# Patient Record
Sex: Male | Born: 1935 | Race: White | Hispanic: No | Marital: Married | Smoking: Former smoker
Health system: Southern US, Community
[De-identification: ages and names within clinical notes are randomized; demographics above are authoritative.]

## PROBLEM LIST (undated history)

## (undated) DIAGNOSIS — I10 Essential (primary) hypertension: Secondary | ICD-10-CM

## (undated) DIAGNOSIS — K219 Gastro-esophageal reflux disease without esophagitis: Secondary | ICD-10-CM

## (undated) DIAGNOSIS — I2510 Atherosclerotic heart disease of native coronary artery without angina pectoris: Secondary | ICD-10-CM

## (undated) DIAGNOSIS — T8859XA Other complications of anesthesia, initial encounter: Secondary | ICD-10-CM

## (undated) DIAGNOSIS — Z8619 Personal history of other infectious and parasitic diseases: Secondary | ICD-10-CM

## (undated) DIAGNOSIS — R32 Unspecified urinary incontinence: Secondary | ICD-10-CM

## (undated) DIAGNOSIS — I4891 Unspecified atrial fibrillation: Secondary | ICD-10-CM

## (undated) DIAGNOSIS — R001 Bradycardia, unspecified: Secondary | ICD-10-CM

## (undated) DIAGNOSIS — E785 Hyperlipidemia, unspecified: Secondary | ICD-10-CM

## (undated) DIAGNOSIS — C61 Malignant neoplasm of prostate: Secondary | ICD-10-CM

## (undated) DIAGNOSIS — T4145XA Adverse effect of unspecified anesthetic, initial encounter: Secondary | ICD-10-CM

## (undated) DIAGNOSIS — K4090 Unilateral inguinal hernia, without obstruction or gangrene, not specified as recurrent: Secondary | ICD-10-CM

## (undated) DIAGNOSIS — I499 Cardiac arrhythmia, unspecified: Secondary | ICD-10-CM

## (undated) HISTORY — DX: Atherosclerotic heart disease of native coronary artery without angina pectoris: I25.10

## (undated) HISTORY — DX: Hyperlipidemia, unspecified: E78.5

## (undated) HISTORY — DX: Malignant neoplasm of prostate: C61

## (undated) HISTORY — PX: PROSTATECTOMY: SHX69

## (undated) HISTORY — PX: CATARACT EXTRACTION: SUR2

## (undated) HISTORY — DX: Bradycardia, unspecified: R00.1

## (undated) HISTORY — DX: Essential (primary) hypertension: I10

## (undated) HISTORY — PX: CORONARY ARTERY BYPASS GRAFT: SHX141

---

## 2007-12-28 ENCOUNTER — Inpatient Hospital Stay (HOSPITAL_COMMUNITY): Admission: RE | Admit: 2007-12-28 | Discharge: 2007-12-30 | Payer: Self-pay | Admitting: Urology

## 2007-12-28 ENCOUNTER — Encounter (INDEPENDENT_AMBULATORY_CARE_PROVIDER_SITE_OTHER): Payer: Self-pay | Admitting: Urology

## 2010-10-09 NOTE — Discharge Summary (Signed)
NAME:  Lance Trujillo, Lance Trujillo NO.:  [id number]   MEDICAL RECORD NO.:  [id number]          PATIENT TYPE:  INP   LOCATION:  1406                         FACILITY:  [facility]   PHYSICIAN:  Heloise Purpura, MD      DATE OF BIRTH:  [birth year]   DATE OF ADMISSION:  12/28/2007  DATE OF DISCHARGE:                               DISCHARGE SUMMARY   ADMISSION DIAGNOSIS:  Clinically localized prostate cancer.   DISCHARGE DIAGNOSIS:  Clinically localized prostate cancer.   HISTORY AND PHYSICAL:  For full details please see admission history and  physical.  Briefly, Lance Trujillo is a 75 year old gentleman who was found to  have clinically localized adenocarcinoma of prostate.  After discussion  regarding management options for treatment, he elected to proceed with  surgical therapy and a robotic assisted laparoscopic radical  prostatectomy.   HOSPITAL COURSE:  On December 28, 2007, he was taken to the operating room  and underwent a robotic assisted laparoscopic radical prostatectomy  which he tolerated well and without complications.  Postoperatively, he  was able to be transferred to a regular hospital room and was able to  begin ambulation that evening.  He remained hemodynamically stable.  His  postoperative hematocrit was 45.6.  On the morning of postoperative day  #1, his hematocrit was rechecked and found to be 41.0.  Although his  blood pressure remained stable and his urine output remained excellent,  it was decided to recheck his hematocrit later on that morning and at  approximately noon this day was found to be 38.3 indicating that it was  indeed stable and mostly dilutional.  He maintained excellent urine  output with minimal output from his pelvic drain and his pelvic drain  was removed.  He was placed on a clear liquid diet and continued to  ambulate.  He was reevaluated on the afternoon of postoperative day #1  and had significant abdominal discomfort with some nausea and was  not  tolerating his clear liquid diet well.  It was therefore decided to  proceed with further observation.  He was administered a Dulcolax  suppository and by the morning of postoperative day #2, he had passed  flatus and felt much improved.  He was therefore felt to be stable for  discharge and had met all discharge criteria.   DISPOSITION:  Home.   DISCHARGE MEDICATIONS:  He was instructed to resume his regular home  medications including Toprol, Lipitor, and hydrochlorothiazide.  In  addition, he was provided a prescription for Vicodin to take as needed  for pain and told to use Colace as a stool softener.  He was also given  a prescription to begin Cipro 1 day prior to his return visit for Foley  catheter removal.   DISCHARGE INSTRUCTIONS:  He was instructed to be ambulatory but  specifically told to refrain from any heavy lifting, strenuous activity,  or driving.  He was instructed on routine Foley catheter care and told  to gradually advance his diet over the course of the next few days.   SURGICAL PATHOLOGY:  His surgical pathology results did return while  hospitalized which indicated a PT2, CNX, MX, Gleason score 3+4 equals 7  adenocarcinoma of the prostate.  Surgical margins were negative.  These  results were discussed with the patient during his hospitalization.   FOLLOW UP:  He will follow-up in 1 week for Foley catheter removal.      Heloise Purpura, MD  Electronically Signed     LB/MEDQ  D:  12/30/2007  T:  12/30/2007  Job:  [id number]

## 2010-10-09 NOTE — Op Note (Signed)
NAME:  Lance Trujillo, Lance Trujillo NO.:  [id number]   MEDICAL RECORD NO.:  [id number]          PATIENT TYPE:  INP   LOCATION:  0004                         FACILITY:  [facility]   PHYSICIAN:  Heloise Purpura, MD      DATE OF BIRTH:  [birth year]   DATE OF PROCEDURE:  12/28/2007  DATE OF DISCHARGE:                               OPERATIVE REPORT   PREOPERATIVE DIAGNOSIS:  Clinically localized adenocarcinoma of the  prostate (clinical stage T1C NX MX).   POSTOPERATIVE DIAGNOSIS:  Clinically localized adenocarcinoma of the  prostate (clinical stage T1C NX MX).   PROCEDURE:  Robotic assisted laparoscopic radical prostatectomy  (bilateral nerve sparing).   SURGEON:  Heloise Purpura, M.D.   ASSISTANT:  Dr. Delman Kitten.   ANESTHESIA:  General.   COMPLICATIONS:  None.   ESTIMATED BLOOD LOSS:  75 mL.   SPECIMENS:  Prostate and seminal vesicles.  Disposition of specimen to  pathology.   DRAINS:  1. A 20-French coude catheter.  2. A #19 Blake pelvic drain.   INDICATIONS:  Lance Trujillo is a 75 year old gentleman with clinically  localized adenocarcinoma of the prostate.  After discussion regarding  management options for treatment, he elected to proceed with surgical  therapy and the above procedure.  Potential risks, complications, and  alternative options associated with this procedure were discussed in  detail and informed consent was obtained.   DESCRIPTION OF PROCEDURE:  The patient was taken to the operating room  and a general anesthetic was administered.  He was given preoperative  antibiotics, placed in the dorsal lithotomy position, prepped and draped  in the usual sterile fashion.  Next a preoperative time-out was  performed.  A Foley catheter was then inserted into the bladder and a  site was selected just to the left of the umbilicus for placement of the  camera port.  This was placed using a standard open Hasson technique.  This allowed entry into the peritoneal cavity  under direct vision  without difficulty.  A 12 mm port was then placed and a pneumoperitoneum  was established.  The 0 degree lens was then used to inspect the abdomen  and there was no evidence of any intra-abdominal injuries or other  abnormalities.  The remaining ports were then placed.  Bilateral 8 mm  robotic ports were placed 10 cm lateral to and just inferior to the  camera port site.  An additional 8 mm robotic port was placed in the far  left lateral abdominal wall.  A 5 mm port was placed between the camera  port and the right robotic port and an additional 12 mm port was placed  in the far right lateral abdominal wall for laparoscopic assistance.  All ports were placed under direct vision and without difficulty.  The  surgical cart was then docked.  With the aid of the cautery scissors,  the bladder was reflected posteriorly allowing entry into the space of  Retzius and identification of the endopelvic fascia and prostate.  The  endopelvic fascia was then incised from the apex back to the  base of  prostate bilaterally and the underlying levator muscle fibers were swept  laterally off the prostate.  On the right side, an accessory obturator  artery was identified and was able to be preserved.  The dorsal vein was  thereby isolated.  It was then stapled and divided with a 45 mm flex ETS  stapler.  The bladder neck was then identified with the aid of Foley  catheter manipulation and was divided anteriorly thereby exposing the  catheter.  The catheter balloon was deflated and the catheter was  brought into the operative field and used to retract the prostate  anteriorly.  The posterior bladder neck was then divided and dissection  proceeded posteriorly until the vasa deferentia and seminal vesicles  were identified.  The vasa deferentia were isolated, divided and lifted  anteriorly.  The seminal vessels were then dissected down to their tips  with care to control the seminal vesicle  arterial blood supply with Hem-  o-lok clips.  The space between Denonvilliers fascia and the anterior  rectum was then bluntly developed thereby isolating the vascular  pedicles of the prostate.  The lateral prostatic fascia was then sharply  divided allowing the neurovascular bundles to be released posteriorly  and laterally on each side.  The vascular pedicles of the prostate were  then ligated with Hem-o-lok clips above the level of the neurovascular  bundles and divided with sharp cold scissor dissection.  The  neurovascular bundles were then swept off the apex of the prostate and  urethra.  The urethra was sharply divided allowing the prostate specimen  to be disarticulated.  The pelvis was then copiously irrigated and  hemostasis was ensured.  With irrigation of the pelvis, air was injected  into the rectal catheter and there was no evidence for any rectal  injury.  Attention then turned to the anastomosis.  A 2-0 Vicryl slip-  knot was placed at the 6 o'clock position between the bladder neck,  Denonvilliers fascia, and the urethra to reapproximate these structures.  A double-armed 3-0 Monocryl suture was then used to perform a 360 degree  running tension-free anastomosis between the bladder neck and urethra.  A 20-French coude catheter was inserted into the bladder and irrigated.  The anastomosis appeared to be watertight and there were no blood clots  within the bladder.  The #19 Harrison Mons drain was then brought through the  left robotic port and appropriately positioned in the pelvis.  It was  secured to the skin with a nylon suture.  The surgical cart was then  undocked.  The right lateral 12 mm port site was closed with a 0 Vicryl  suture placed with the aid of the [employer] needle.  The prostate  specimen was then removed intact via the periumbilical port site with  the Endopouch retrieval bag.  This fascial opening was closed with a  running 0 Vicryl suture.  All remaining  ports had been removed under  direct vision and without difficulty.  The drain was sutured in place  with a nylon suture.  All port sites were then injected with 0.25%  Marcaine and reapproximated at the skin level with staples.  Sterile  dressings were applied.  The patient appeared to tolerate the procedure  well and without complications.  He was able to be extubated and  transferred to the recovery unit in satisfactory condition.      Heloise Purpura, MD  Electronically Signed     LB/MEDQ  D:  12/28/2007  T:  12/28/2007  Job:  [id number]

## 2011-02-22 LAB — BASIC METABOLIC PANEL
BUN: 9
Calcium: 9.7
Creatinine, Ser: 1.04
GFR calc non Af Amer: >60
Glucose, Bld: 91
Potassium: 4

## 2011-02-22 LAB — HEMOGLOBIN AND HEMATOCRIT, BLOOD
HCT: 39.3
Hemoglobin: 14.1

## 2011-02-22 LAB — CBC
HCT: 48.1
Platelets: 198
RDW: 13.3

## 2011-02-22 LAB — TYPE AND SCREEN: ABO/RH(D): O POS

## 2011-12-30 ENCOUNTER — Ambulatory Visit (INDEPENDENT_AMBULATORY_CARE_PROVIDER_SITE_OTHER): Payer: Medicare Other | Admitting: Nurse Practitioner

## 2011-12-30 ENCOUNTER — Encounter: Payer: Self-pay | Admitting: Nurse Practitioner

## 2011-12-30 ENCOUNTER — Telehealth: Payer: Self-pay | Admitting: *Deleted

## 2011-12-30 ENCOUNTER — Ambulatory Visit
Admission: RE | Admit: 2011-12-30 | Discharge: 2011-12-30 | Disposition: A | Discharge status: Home / Self Care | Payer: Medicare Other | Source: Ambulatory Visit | Attending: Nurse Practitioner | Admitting: Nurse Practitioner

## 2011-12-30 ENCOUNTER — Other Ambulatory Visit: Payer: Self-pay | Admitting: Nurse Practitioner

## 2011-12-30 VITALS — BP 172/78 | HR 45 | Ht 66.0 in | Wt 159.1 lb

## 2011-12-30 DIAGNOSIS — R079 Chest pain, unspecified: Secondary | ICD-10-CM

## 2011-12-30 DIAGNOSIS — Z01810 Encounter for preprocedural cardiovascular examination: Secondary | ICD-10-CM

## 2011-12-30 LAB — PROTIME-INR
INR: 1 ratio (ref 0.8–1.0)
Prothrombin Time: 11.4 s (ref 10.2–12.4)

## 2011-12-30 LAB — HEPATIC FUNCTION PANEL
ALT: 32 U/L (ref 0–53)
AST: 28 U/L (ref 0–37)
Albumin: 4.3 g/dL (ref 3.5–5.2)
Alkaline Phosphatase: 60 U/L (ref 39–117)
Bilirubin, Direct: 0.1 mg/dL (ref 0.0–0.3)
Total Bilirubin: 0.9 mg/dL (ref 0.3–1.2)
Total Protein: 7.7 g/dL (ref 6.0–8.3)

## 2011-12-30 LAB — BASIC METABOLIC PANEL
BUN: 9 mg/dL (ref 6–23)
CO2: 29 mEq/L (ref 19–32)
Calcium: 9.6 mg/dL (ref 8.4–10.5)
Chloride: 100 mEq/L (ref 96–112)
Creatinine, Ser: 1 mg/dL (ref 0.4–1.5)
GFR: 79.44 mL/min (ref >60.00)
Glucose, Bld: 84 mg/dL (ref 70–99)
Potassium: 4.1 mEq/L (ref 3.5–5.1)
Sodium: 137 mEq/L (ref 135–145)

## 2011-12-30 LAB — CBC WITH DIFFERENTIAL/PLATELET
Basophils Absolute: 0 10*3/uL (ref 0.0–0.1)
Basophils Relative: 0.5 % (ref 0.0–3.0)
Eosinophils Absolute: 0.1 10*3/uL (ref 0.0–0.7)
Eosinophils Relative: 1 % (ref 0.0–5.0)
HCT: 50.2 % (ref 39.0–52.0)
Hemoglobin: 16.8 g/dL (ref 13.0–17.0)
Lymphocytes Relative: 16 % (ref 12.0–46.0)
Lymphs Abs: 0.9 10*3/uL (ref 0.7–4.0)
MCHC: 33.3 g/dL (ref 30.0–36.0)
MCV: 85.7 fl (ref 78.0–100.0)
Monocytes Absolute: 0.4 10*3/uL (ref 0.1–1.0)
Monocytes Relative: 6.2 % (ref 3.0–12.0)
Neutro Abs: 4.4 10*3/uL (ref 1.4–7.7)
Neutrophils Relative %: 76.3 % (ref 43.0–77.0)
Platelets: 207 10*3/uL (ref 150.0–400.0)
RBC: 5.86 Mil/uL — ABNORMAL HIGH (ref 4.22–5.81)
RDW: 13.6 % (ref 11.5–14.6)
WBC: 5.7 10*3/uL (ref 4.5–10.5)

## 2011-12-30 LAB — APTT: aPTT: 35.7 s — ABNORMAL HIGH (ref 21.7–28.8)

## 2011-12-30 LAB — LIPID PANEL
Cholesterol: 154 mg/dL (ref 0–200)
HDL: 45 mg/dL (ref >39.00)
LDL Cholesterol: 86 mg/dL (ref 0–99)
Total CHOL/HDL Ratio: 3
Triglycerides: 114 mg/dL (ref 0.0–149.0)
VLDL: 22.8 mg/dL (ref 0.0–40.0)

## 2011-12-30 MED ORDER — NITROGLYCERIN 0.4 MG SL SUBL: 0.4000 mg | SUBLINGUAL_TABLET | SUBLINGUAL | Status: DC | PRN

## 2011-12-30 MED ORDER — ASPIRIN 81 MG PO TBEC: 81.0000 mg | DELAYED_RELEASE_TABLET | Freq: Every day | ORAL | Status: AC

## 2011-12-30 NOTE — Telephone Encounter (Signed)
Message copied by Awilda Bill on Mon Dec 30, 2011  4:54 PM ------      Message from: Rosalio Macadamia      Created: Mon Dec 30, 2011  1:39 PM       Ok to report. CXR is ok. He is staying at his son's house here in [facility].

## 2011-12-30 NOTE — Patient Instructions (Addendum)
We are going to check labs today.   We are going to arrange for a heart catheterization tomorrow  We need to get a chest x ray today at [facility] at the [facility]  You are scheduled for a cardiac catheterization on Tuesday, August 6th with Dr. Swaziland.  Go to [facility] on Tuesday at 11AM. No food or drink after midnight on tonight. You may take your medications with a sip of water on the day of your procedure.   Start aspirin 81 mg daily  I have sent a prescription for nitroglycerin to the drug store. Use your NTG under your tongue for recurrent chest pain. May take one tablet every 5 minutes. If you are still having discomfort after 3 tablets in 15 minutes, call 911.  Call the [facility] office at [phone] if you have any questions, problems or concerns.

## 2011-12-30 NOTE — Telephone Encounter (Signed)
Called number listed for patients son Riaz Onorato.  Left voicemail for patient to call back.  Vista Mink, CMA

## 2011-12-30 NOTE — Telephone Encounter (Signed)
Dr. Carolynne Edouard returned my call regarding his fathers chest x-ray.  Gave me Alice Houchen's cell number.  Called patient and notified of cxr results.  Vista Mink, CMA

## 2011-12-30 NOTE — Assessment & Plan Note (Signed)
He is describing classic anginal symptoms. Has HTN and HLD with a strikingly positive family history. Former smoker. I have recommended aspirin 81 mg. Samples are given. Have also advised proceeding on with cardiac catheterization tomorrow with Dr. Swaziland. He is agreeable. He has had no symptoms at rest. NTG was prescribed today as well and full instruction given. The risk, procedure, and benefits have been reviewed and he is willing to proceed. Will check labs and CXR today. Patient is agreeable to this plan and will call if any problems develop in the interim.

## 2011-12-30 NOTE — Progress Notes (Signed)
 Lance Trujillo Date of Birth: 04/08/1936 Medical Record #[id number]  History of Present Illness: Lance Trujillo is seen today for a new patient visit. He is seen for Dr. Jordan. He has seen Dr. Jordan approximately 3 to 4 years ago. Those records are not available. He is Dr. Paci's father. He has had HTN x 10 years and HLD. No known heart disease. Had remote stress testing prior to his surgery for prostate cancer. 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   He comes in today. He is here with his son. He reports at least a one year history of DOE. This is progressively getting worse. He is now almost unable to cut his grass or take his garbage can to the street. For the past couple of months, he has had an exertional tightness in his chest that resolves with rest. He has had no symptoms at rest. He notes that his symptoms seem to be getting worse and more frequent, certainly not going away.  He has a chronic bradycardia. No known DM. Family history is strikingly positive for CAD. Former smoker. No regular exercise but tries to stay busy. Family found out about his symptoms. He tried to get in with cardiology at [employer] since he is a former employee but was told he could not be seen until 2014. He does not check his blood pressure at home. Saw his PCP about 3 months ago and it was "ok".   Current Outpatient Prescriptions on File Prior to Visit  Medication Sig Dispense Refill  . atorvastatin (LIPITOR) 10 MG tablet Take 10 mg by mouth daily.      . hydrochlorothiazide (HYDRODIURIL) 50 MG tablet Take 50 mg by mouth daily.      . metoprolol succinate (TOPROL XL) 25 MG 24 hr tablet Take 12.5 mg by mouth daily.      . nitroGLYCERIN (NITROSTAT) 0.4 MG SL tablet Place 1 tablet (0.4 mg total) under the tongue every 5 (five) minutes as needed for chest pain.  25 tablet  3    Allergies  Allergen Reactions  . Clindamycin/Lincomycin     Past Medical History  Diagnosis Date  . Prostate  cancer     s/p robotic prostatectomy per Dr. Borden  . HTN (hypertension)   . HLD (hyperlipidemia)   . Bradycardia     Past Surgical History  Procedure Date  . Prostatectomy   . Cataract extraction     History  Smoking status  . Former Smoker  . Types: Cigarettes  . Quit date: 12/29/1981  Smokeless tobacco  . Never Used    History  Alcohol Use  . Yes    occasional, formerly heavy intermittent use    History reviewed. No pertinent family history.  Review of Systems: The review of systems is per the HPI.  All other systems were reviewed and are negative.  Physical Exam: BP 172/78  Pulse 45  Ht 5' 6" (1.676 m)  Wt 159 lb 1.9 oz (72.176 kg)  BMI 25.68 kg/m2 Patient is very pleasant and in no acute distress. Skin is warm and dry. Color is normal.  HEENT is unremarkable. Normocephalic/atraumatic. PERRL. Sclera are nonicteric. Neck is supple. No masses. No JVD. Lungs show decreased breath sounds. Cardiac exam shows a regular rate and rhythm, yet slow. Abdomen is soft. Extremities are without edema. Gait and ROM are intact. No gross neurologic deficits noted.   LABORATORY DATA: EKG shows sinus bradycardia.   No acute changes. Tracing was reviewed with Dr. Brackbill today (DOD).   Other labs are pending. CXR is pending.    Assessment / Plan:  

## 2011-12-31 ENCOUNTER — Encounter (HOSPITAL_COMMUNITY): Payer: Self-pay | Admitting: Certified Registered"

## 2011-12-31 ENCOUNTER — Other Ambulatory Visit: Payer: Self-pay | Admitting: Cardiothoracic Surgery

## 2011-12-31 ENCOUNTER — Ambulatory Visit (HOSPITAL_COMMUNITY): Payer: Medicare Other | Admitting: Certified Registered"

## 2011-12-31 ENCOUNTER — Inpatient Hospital Stay (HOSPITAL_COMMUNITY)
Admission: RE | Admit: 2011-12-31 | Discharge: 2012-01-09 | DRG: 234 | Disposition: A | Discharge status: Home / Self Care | Payer: Medicare Other | Source: Ambulatory Visit | Attending: Cardiothoracic Surgery | Admitting: Cardiothoracic Surgery

## 2011-12-31 ENCOUNTER — Inpatient Hospital Stay (HOSPITAL_COMMUNITY): Payer: Medicare Other

## 2011-12-31 ENCOUNTER — Encounter (HOSPITAL_COMMUNITY)
Admission: RE | Disposition: A | Discharge status: Home / Self Care | Payer: Self-pay | Source: Ambulatory Visit | Attending: Cardiothoracic Surgery

## 2011-12-31 DIAGNOSIS — Z01810 Encounter for preprocedural cardiovascular examination: Secondary | ICD-10-CM

## 2011-12-31 DIAGNOSIS — I4891 Unspecified atrial fibrillation: Secondary | ICD-10-CM | POA: Diagnosis not present

## 2011-12-31 DIAGNOSIS — R001 Bradycardia, unspecified: Secondary | ICD-10-CM

## 2011-12-31 DIAGNOSIS — R079 Chest pain, unspecified: Secondary | ICD-10-CM

## 2011-12-31 DIAGNOSIS — I2510 Atherosclerotic heart disease of native coronary artery without angina pectoris: Secondary | ICD-10-CM

## 2011-12-31 DIAGNOSIS — Z951 Presence of aortocoronary bypass graft: Secondary | ICD-10-CM

## 2011-12-31 DIAGNOSIS — Z79899 Other long term (current) drug therapy: Secondary | ICD-10-CM

## 2011-12-31 DIAGNOSIS — E876 Hypokalemia: Secondary | ICD-10-CM | POA: Clinically undetermined

## 2011-12-31 DIAGNOSIS — E785 Hyperlipidemia, unspecified: Secondary | ICD-10-CM | POA: Diagnosis present

## 2011-12-31 DIAGNOSIS — I493 Ventricular premature depolarization: Secondary | ICD-10-CM | POA: Diagnosis present

## 2011-12-31 DIAGNOSIS — Z8249 Family history of ischemic heart disease and other diseases of the circulatory system: Secondary | ICD-10-CM

## 2011-12-31 DIAGNOSIS — I498 Other specified cardiac arrhythmias: Secondary | ICD-10-CM | POA: Diagnosis present

## 2011-12-31 DIAGNOSIS — E039 Hypothyroidism, unspecified: Secondary | ICD-10-CM | POA: Diagnosis not present

## 2011-12-31 DIAGNOSIS — Z7982 Long term (current) use of aspirin: Secondary | ICD-10-CM

## 2011-12-31 DIAGNOSIS — I200 Unstable angina: Secondary | ICD-10-CM | POA: Diagnosis present

## 2011-12-31 DIAGNOSIS — E8779 Other fluid overload: Secondary | ICD-10-CM | POA: Diagnosis not present

## 2011-12-31 DIAGNOSIS — I10 Essential (primary) hypertension: Secondary | ICD-10-CM | POA: Diagnosis present

## 2011-12-31 DIAGNOSIS — I4949 Other premature depolarization: Secondary | ICD-10-CM | POA: Diagnosis not present

## 2011-12-31 DIAGNOSIS — D62 Acute posthemorrhagic anemia: Secondary | ICD-10-CM | POA: Diagnosis not present

## 2011-12-31 DIAGNOSIS — Z8546 Personal history of malignant neoplasm of prostate: Secondary | ICD-10-CM

## 2011-12-31 DIAGNOSIS — J9819 Other pulmonary collapse: Secondary | ICD-10-CM | POA: Diagnosis not present

## 2011-12-31 DIAGNOSIS — Z87891 Personal history of nicotine dependence: Secondary | ICD-10-CM

## 2011-12-31 DIAGNOSIS — J90 Pleural effusion, not elsewhere classified: Secondary | ICD-10-CM | POA: Diagnosis not present

## 2011-12-31 HISTORY — PX: CORONARY ARTERY BYPASS GRAFT: SHX141

## 2011-12-31 HISTORY — DX: Atherosclerotic heart disease of native coronary artery without angina pectoris: I25.10

## 2011-12-31 HISTORY — PX: LEFT HEART CATHETERIZATION WITH CORONARY ANGIOGRAM: SHX5451

## 2011-12-31 HISTORY — DX: Unspecified atrial fibrillation: I48.91

## 2011-12-31 LAB — POCT I-STAT 4, (NA,K, GLUC, HGB,HCT)
Glucose, Bld: 81 mg/dL (ref 70–99)
Glucose, Bld: 85 mg/dL (ref 70–99)
HCT: 27 % — ABNORMAL LOW (ref 39.0–52.0)
HCT: 23 % — ABNORMAL LOW (ref 39.0–52.0)
Hemoglobin: 12.9 g/dL — ABNORMAL LOW (ref 13.0–17.0)
Hemoglobin: 8.5 g/dL — ABNORMAL LOW (ref 13.0–17.0)
Hemoglobin: 7.8 g/dL — ABNORMAL LOW (ref 13.0–17.0)
Potassium: 3.4 mEq/L — ABNORMAL LOW (ref 3.5–5.1)
Potassium: 3.5 mEq/L (ref 3.5–5.1)
Potassium: 3.6 meq/L (ref 3.5–5.1)
Sodium: 139 mEq/L (ref 135–145)
Sodium: 135 mEq/L (ref 135–145)
Sodium: 136 mEq/L (ref 135–145)
Sodium: 135 meq/L (ref 135–145)

## 2011-12-31 LAB — TYPE AND SCREEN
ABO/RH(D): O POS
Antibody Screen: NEGATIVE

## 2011-12-31 LAB — CBC
HCT: 35.9 % — ABNORMAL LOW (ref 39.0–52.0)
MCH: 28.8 pg (ref 26.0–34.0)
MCV: 82 fL (ref 78.0–100.0)
RDW: 13.1 % (ref 11.5–15.5)
WBC: 8.2 10*3/uL (ref 4.0–10.5)

## 2011-12-31 LAB — APTT: aPTT: 44 seconds — ABNORMAL HIGH (ref 24–37)

## 2011-12-31 LAB — HEMOGLOBIN AND HEMATOCRIT, BLOOD
HCT: 25.7 % — ABNORMAL LOW (ref 39.0–52.0)
Hemoglobin: 9 g/dL — ABNORMAL LOW (ref 13.0–17.0)

## 2011-12-31 LAB — POCT I-STAT 3, ART BLOOD GAS (G3+)
Bicarbonate: 24.9 mEq/L — ABNORMAL HIGH (ref 20.0–24.0)
O2 Saturation: 100 %
pO2, Arterial: 274 mmHg — ABNORMAL HIGH (ref 80.0–100.0)

## 2011-12-31 LAB — POCT I-STAT GLUCOSE
Glucose, Bld: 88 mg/dL (ref 70–99)
Operator id: 3406

## 2011-12-31 LAB — PLATELET COUNT: Platelets: 92 10*3/uL — ABNORMAL LOW (ref 150–400)

## 2011-12-31 SURGERY — CORONARY ARTERY BYPASS GRAFTING (CABG)
Anesthesia: General | Site: Chest | Wound class: Clean

## 2011-12-31 SURGERY — LEFT HEART CATHETERIZATION WITH CORONARY ANGIOGRAM
Anesthesia: LOCAL

## 2011-12-31 MED ORDER — SODIUM BICARBONATE 8.4 % IV SOLN
INTRAVENOUS | Status: DC
  Filled 2011-12-31: qty 2.5

## 2011-12-31 MED ORDER — SODIUM CHLORIDE 0.9 % IJ SOLN
OROMUCOSAL | Status: DC | PRN
  Administered 2011-12-31 (×3): via TOPICAL

## 2011-12-31 MED ORDER — SODIUM CHLORIDE 0.9 % IV SOLN: 250.0000 mL | INTRAVENOUS | Status: DC

## 2011-12-31 MED ORDER — DEXTROSE 5 % IV SOLN
1.5000 g | Freq: Two times a day (BID) | INTRAVENOUS | Status: DC
  Administered 2011-12-31 – 2012-01-01 (×3): 1.5 g via INTRAVENOUS
  Filled 2011-12-31 (×4): qty 1.5

## 2011-12-31 MED ORDER — DEXMEDETOMIDINE HCL IN NACL 200 MCG/50ML IV SOLN: 0.1000 ug/kg/h | INTRAVENOUS | Status: DC

## 2011-12-31 MED ORDER — SODIUM CHLORIDE 0.9 % IJ SOLN: 3.0000 mL | INTRAMUSCULAR | Status: DC | PRN

## 2011-12-31 MED ORDER — ROCURONIUM BROMIDE 100 MG/10ML IV SOLN
INTRAVENOUS | Status: DC | PRN
  Administered 2011-12-31 (×2): 50 mg via INTRAVENOUS

## 2011-12-31 MED ORDER — LACTATED RINGERS IV SOLN
INTRAVENOUS | Status: DC | PRN
  Administered 2011-12-31 (×2): via INTRAVENOUS

## 2011-12-31 MED ORDER — VANCOMYCIN HCL 1000 MG IV SOLR
1250.0000 mg | INTRAVENOUS | Status: AC
  Administered 2011-12-31: 1250 mg via INTRAVENOUS
  Filled 2011-12-31: qty 1250

## 2011-12-31 MED ORDER — METOPROLOL TARTRATE 12.5 MG HALF TABLET
12.5000 mg | ORAL_TABLET | Freq: Two times a day (BID) | ORAL | Status: DC
  Administered 2012-01-01 (×2): 12.5 mg via ORAL
  Filled 2011-12-31 (×5): qty 1

## 2011-12-31 MED ORDER — ACETAMINOPHEN 160 MG/5ML PO SOLN
975.0000 mg | Freq: Four times a day (QID) | ORAL | Status: DC
Start: 2012-01-01 — End: 2012-01-02

## 2011-12-31 MED ORDER — POTASSIUM CHLORIDE 2 MEQ/ML IV SOLN
80.0000 meq | INTRAVENOUS | Status: DC
  Filled 2011-12-31: qty 40

## 2011-12-31 MED ORDER — INSULIN REGULAR BOLUS VIA INFUSION
0.0000 [IU] | Freq: Three times a day (TID) | INTRAVENOUS | Status: DC
  Filled 2011-12-31: qty 10

## 2011-12-31 MED ORDER — ALBUMIN HUMAN 5 % IV SOLN
INTRAVENOUS | Status: DC | PRN
  Administered 2011-12-31: 20:00:00 via INTRAVENOUS

## 2011-12-31 MED ORDER — TRANEXAMIC ACID (OHS) BOLUS VIA INFUSION
15.0000 mg/kg | INTRAVENOUS | Status: AC
  Administered 2011-12-31: 1081.5 mg via INTRAVENOUS
  Filled 2011-12-31: qty 1082

## 2011-12-31 MED ORDER — ACETAMINOPHEN 650 MG RE SUPP: 650.0000 mg | RECTAL | Status: AC

## 2011-12-31 MED ORDER — 0.9 % SODIUM CHLORIDE (POUR BTL) OPTIME
TOPICAL | Status: DC | PRN
  Administered 2011-12-31: 6000 mL

## 2011-12-31 MED ORDER — ASPIRIN 81 MG PO CHEW: 324.0000 mg | CHEWABLE_TABLET | Freq: Every day | ORAL | Status: DC

## 2011-12-31 MED ORDER — FAMOTIDINE IN NACL 20-0.9 MG/50ML-% IV SOLN
20.0000 mg | Freq: Two times a day (BID) | INTRAVENOUS | Status: DC
  Administered 2011-12-31: 20 mg via INTRAVENOUS

## 2011-12-31 MED ORDER — HEMOSTATIC AGENTS (NO CHARGE) OPTIME
TOPICAL | Status: DC | PRN
  Administered 2011-12-31 (×2): 1 via TOPICAL

## 2011-12-31 MED ORDER — MAGNESIUM SULFATE 40 MG/ML IJ SOLN
4.0000 g | Freq: Once | INTRAMUSCULAR | Status: AC
  Administered 2011-12-31: 4 g via INTRAVENOUS
  Filled 2011-12-31: qty 100

## 2011-12-31 MED ORDER — SODIUM CHLORIDE 0.9 % IV SOLN
INTRAVENOUS | Status: DC
  Filled 2011-12-31: qty 1

## 2011-12-31 MED ORDER — DEXTROSE 5 % IV SOLN
750.0000 mg | INTRAVENOUS | Status: DC
  Filled 2011-12-31: qty 750

## 2011-12-31 MED ORDER — MORPHINE SULFATE 2 MG/ML IJ SOLN
2.0000 mg | INTRAMUSCULAR | Status: DC | PRN
  Administered 2012-01-01 (×3): 2 mg via INTRAVENOUS
  Filled 2011-12-31 (×2): qty 1

## 2011-12-31 MED ORDER — TEMAZEPAM 15 MG PO CAPS: 15.0000 mg | ORAL_CAPSULE | Freq: Once | ORAL | Status: DC | PRN

## 2011-12-31 MED ORDER — LACTATED RINGERS IV SOLN: 500.0000 mL | Freq: Once | INTRAVENOUS | Status: AC | PRN

## 2011-12-31 MED ORDER — ACETAMINOPHEN 160 MG/5ML PO SOLN
650.0000 mg | ORAL | Status: AC
  Administered 2011-12-31: 650 mg

## 2011-12-31 MED ORDER — TRANEXAMIC ACID (OHS) PUMP PRIME SOLUTION
2.0000 mg/kg | INTRAVENOUS | Status: DC
  Filled 2011-12-31: qty 1.44

## 2011-12-31 MED ORDER — PHENYLEPHRINE HCL 10 MG/ML IJ SOLN
30.0000 ug/min | INTRAVENOUS | Status: DC
  Administered 2011-12-31: 10 ug/min via INTRAVENOUS
  Filled 2011-12-31: qty 2

## 2011-12-31 MED ORDER — FENTANYL CITRATE 0.05 MG/ML IJ SOLN
INTRAMUSCULAR | Status: AC
  Filled 2011-12-31: qty 2

## 2011-12-31 MED ORDER — MAGNESIUM SULFATE 50 % IJ SOLN
40.0000 meq | INTRAMUSCULAR | Status: DC
  Filled 2011-12-31: qty 10

## 2011-12-31 MED ORDER — PROTAMINE SULFATE 10 MG/ML IV SOLN
INTRAVENOUS | Status: DC | PRN
  Administered 2011-12-31: 250 mg via INTRAVENOUS

## 2011-12-31 MED ORDER — FENTANYL CITRATE 0.05 MG/ML IJ SOLN
INTRAMUSCULAR | Status: DC | PRN
  Administered 2011-12-31 (×4): 100 ug via INTRAVENOUS
  Administered 2011-12-31: 750 ug via INTRAVENOUS
  Administered 2011-12-31 (×2): 50 ug via INTRAVENOUS
  Administered 2011-12-31 (×2): 100 ug via INTRAVENOUS

## 2011-12-31 MED ORDER — NITROGLYCERIN IN D5W 200-5 MCG/ML-% IV SOLN
2.0000 ug/min | INTRAVENOUS | Status: AC
  Administered 2011-12-31: 10 ug/min via INTRAVENOUS

## 2011-12-31 MED ORDER — VANCOMYCIN HCL IN DEXTROSE 1-5 GM/200ML-% IV SOLN
1000.0000 mg | Freq: Once | INTRAVENOUS | Status: AC
  Administered 2012-01-01: 1000 mg via INTRAVENOUS
  Filled 2011-12-31: qty 200

## 2011-12-31 MED ORDER — SODIUM CHLORIDE 0.9 % IJ SOLN: 3.0000 mL | Freq: Two times a day (BID) | INTRAMUSCULAR | Status: DC

## 2011-12-31 MED ORDER — SODIUM CHLORIDE 0.9 % IJ SOLN
3.0000 mL | INTRAMUSCULAR | Status: DC | PRN
Start: 2011-12-31 — End: 2011-12-31

## 2011-12-31 MED ORDER — ASPIRIN 81 MG PO CHEW
324.0000 mg | CHEWABLE_TABLET | ORAL | Status: AC
  Administered 2011-12-31: 324 mg via ORAL

## 2011-12-31 MED ORDER — METOPROLOL TARTRATE 12.5 MG HALF TABLET: 12.5000 mg | ORAL_TABLET | Freq: Once | ORAL | Status: DC

## 2011-12-31 MED ORDER — MORPHINE SULFATE 2 MG/ML IJ SOLN
1.0000 mg | INTRAMUSCULAR | Status: AC | PRN
  Filled 2011-12-31: qty 1

## 2011-12-31 MED ORDER — EPINEPHRINE HCL 1 MG/ML IJ SOLN
0.5000 ug/min | INTRAVENOUS | Status: DC
  Filled 2011-12-31: qty 4

## 2011-12-31 MED ORDER — SODIUM CHLORIDE 0.9 % IV SOLN
250.0000 mL | INTRAVENOUS | Status: DC | PRN
Start: 2011-12-31 — End: 2011-12-31

## 2011-12-31 MED ORDER — LIDOCAINE HCL (CARDIAC) 20 MG/ML IV SOLN
INTRAVENOUS | Status: DC | PRN
  Administered 2011-12-31: 40 mg via INTRAVENOUS

## 2011-12-31 MED ORDER — OXYCODONE HCL 5 MG PO TABS
5.0000 mg | ORAL_TABLET | ORAL | Status: DC | PRN
  Administered 2012-01-01: 5 mg via ORAL
  Administered 2012-01-01: 10 mg via ORAL
  Administered 2012-01-01: 5 mg via ORAL
  Administered 2012-01-01: 10 mg via ORAL
  Administered 2012-01-01: 5 mg via ORAL
  Administered 2012-01-02 (×2): 10 mg via ORAL
  Filled 2011-12-31 (×2): qty 2
  Filled 2011-12-31: qty 1
  Filled 2011-12-31: qty 2
  Filled 2011-12-31 (×2): qty 1
  Filled 2011-12-31: qty 2

## 2011-12-31 MED ORDER — ASPIRIN EC 325 MG PO TBEC
325.0000 mg | DELAYED_RELEASE_TABLET | Freq: Every day | ORAL | Status: DC
  Administered 2012-01-01: 325 mg via ORAL
  Filled 2011-12-31 (×2): qty 1

## 2011-12-31 MED ORDER — ALBUMIN HUMAN 5 % IV SOLN
250.0000 mL | INTRAVENOUS | Status: AC | PRN
  Administered 2011-12-31 (×3): 250 mL via INTRAVENOUS

## 2011-12-31 MED ORDER — VECURONIUM BROMIDE 10 MG IV SOLR
INTRAVENOUS | Status: DC | PRN
  Administered 2011-12-31 (×2): 5 mg via INTRAVENOUS

## 2011-12-31 MED ORDER — HEPARIN SODIUM (PORCINE) 1000 UNIT/ML IJ SOLN
INTRAMUSCULAR | Status: DC | PRN
  Administered 2011-12-31: 25000 [IU] via INTRAVENOUS

## 2011-12-31 MED ORDER — ASPIRIN 81 MG PO CHEW
CHEWABLE_TABLET | ORAL | Status: AC
  Administered 2011-12-31: 324 mg via ORAL
  Filled 2011-12-31: qty 4

## 2011-12-31 MED ORDER — DOPAMINE-DEXTROSE 3.2-5 MG/ML-% IV SOLN
2.0000 ug/kg/min | INTRAVENOUS | Status: DC
  Filled 2011-12-31: qty 250

## 2011-12-31 MED ORDER — CHLORHEXIDINE GLUCONATE 4 % EX LIQD: 60.0000 mL | Freq: Once | CUTANEOUS | Status: DC

## 2011-12-31 MED ORDER — BISACODYL 5 MG PO TBEC
10.0000 mg | DELAYED_RELEASE_TABLET | Freq: Every day | ORAL | Status: DC
  Administered 2012-01-01: 10 mg via ORAL
  Filled 2011-12-31: qty 2

## 2011-12-31 MED ORDER — TRANEXAMIC ACID 100 MG/ML IV SOLN
1.5000 mg/kg/h | INTRAVENOUS | Status: DC
  Administered 2011-12-31: 1.5 mg/kg/h via INTRAVENOUS
  Filled 2011-12-31: qty 25

## 2011-12-31 MED ORDER — BISACODYL 5 MG PO TBEC: 5.0000 mg | DELAYED_RELEASE_TABLET | Freq: Once | ORAL | Status: DC

## 2011-12-31 MED ORDER — DIAZEPAM 5 MG PO TABS
10.0000 mg | ORAL_TABLET | ORAL | Status: AC
  Administered 2011-12-31: 10 mg via ORAL

## 2011-12-31 MED ORDER — METOPROLOL TARTRATE 1 MG/ML IV SOLN: 2.5000 mg | INTRAVENOUS | Status: DC | PRN

## 2011-12-31 MED ORDER — SODIUM CHLORIDE 0.9 % IV SOLN
INTRAVENOUS | Status: DC
  Administered 2011-12-31: 11:00:00 via INTRAVENOUS

## 2011-12-31 MED ORDER — SODIUM CHLORIDE 0.9 % IV SOLN
INTRAVENOUS | Status: DC
  Administered 2011-12-31: .6 [IU]/h via INTRAVENOUS
  Filled 2011-12-31: qty 1

## 2011-12-31 MED ORDER — MIDAZOLAM HCL 2 MG/2ML IJ SOLN: 2.0000 mg | INTRAMUSCULAR | Status: DC | PRN

## 2011-12-31 MED ORDER — SODIUM CHLORIDE 0.9 % IV SOLN
INTRAVENOUS | Status: DC
  Administered 2011-12-31: 10 mL via INTRAVENOUS

## 2011-12-31 MED ORDER — BISACODYL 10 MG RE SUPP: 10.0000 mg | Freq: Every day | RECTAL | Status: DC

## 2011-12-31 MED ORDER — METOPROLOL TARTRATE 25 MG/10 ML ORAL SUSPENSION
12.5000 mg | Freq: Two times a day (BID) | ORAL | Status: DC
  Filled 2011-12-31 (×5): qty 5

## 2011-12-31 MED ORDER — PHENYLEPHRINE HCL 10 MG/ML IJ SOLN
10.0000 mg | INTRAVENOUS | Status: DC | PRN
  Administered 2011-12-31 (×2): 1 ug/min via INTRAVENOUS

## 2011-12-31 MED ORDER — GLYCOPYRROLATE 0.2 MG/ML IJ SOLN
INTRAMUSCULAR | Status: DC | PRN
  Administered 2011-12-31: .2 mg via INTRAVENOUS
  Administered 2011-12-31: 0.2 mg via INTRAVENOUS

## 2011-12-31 MED ORDER — DOCUSATE SODIUM 100 MG PO CAPS
200.0000 mg | ORAL_CAPSULE | Freq: Every day | ORAL | Status: DC
  Administered 2012-01-01: 200 mg via ORAL
  Filled 2011-12-31: qty 2

## 2011-12-31 MED ORDER — VERAPAMIL HCL 2.5 MG/ML IV SOLN
INTRAVENOUS | Status: AC
  Filled 2011-12-31: qty 2

## 2011-12-31 MED ORDER — LACTATED RINGERS IV SOLN
INTRAVENOUS | Status: DC | PRN
  Administered 2011-12-31 (×2): via INTRAVENOUS

## 2011-12-31 MED ORDER — ONDANSETRON HCL 4 MG/2ML IJ SOLN: 4.0000 mg | Freq: Four times a day (QID) | INTRAMUSCULAR | Status: DC | PRN

## 2011-12-31 MED ORDER — HEPARIN SODIUM (PORCINE) 1000 UNIT/ML IJ SOLN
INTRAMUSCULAR | Status: AC
  Filled 2011-12-31: qty 1

## 2011-12-31 MED ORDER — DEXMEDETOMIDINE HCL IN NACL 400 MCG/100ML IV SOLN
0.1000 ug/kg/h | INTRAVENOUS | Status: AC
  Administered 2011-12-31: .2 ug/kg/h via INTRAVENOUS
  Filled 2011-12-31: qty 100

## 2011-12-31 MED ORDER — PHENYLEPHRINE HCL 10 MG/ML IJ SOLN
0.0000 ug/min | INTRAVENOUS | Status: DC
  Administered 2011-12-31: 25 ug/min via INTRAVENOUS
  Filled 2011-12-31 (×2): qty 2

## 2011-12-31 MED ORDER — SODIUM CHLORIDE 0.9 % IJ SOLN
3.0000 mL | Freq: Two times a day (BID) | INTRAMUSCULAR | Status: DC
  Administered 2012-01-01 – 2012-01-02 (×2): 3 mL via INTRAVENOUS

## 2011-12-31 MED ORDER — MIDAZOLAM HCL 5 MG/5ML IJ SOLN
INTRAMUSCULAR | Status: DC | PRN
  Administered 2011-12-31 (×3): 2 mg via INTRAVENOUS
  Administered 2011-12-31: 4 mg via INTRAVENOUS

## 2011-12-31 MED ORDER — NITROGLYCERIN IN D5W 200-5 MCG/ML-% IV SOLN
0.0000 ug/min | INTRAVENOUS | Status: DC
  Administered 2011-12-31: 10 ug/min via INTRAVENOUS

## 2011-12-31 MED ORDER — SODIUM CHLORIDE 0.45 % IV SOLN
INTRAVENOUS | Status: DC
  Administered 2011-12-31: 20 mL via INTRAVENOUS

## 2011-12-31 MED ORDER — LACTATED RINGERS IV SOLN
INTRAVENOUS | Status: DC
  Administered 2011-12-31: 10 mL via INTRAVENOUS

## 2011-12-31 MED ORDER — PANTOPRAZOLE SODIUM 40 MG PO TBEC: 40.0000 mg | DELAYED_RELEASE_TABLET | Freq: Every day | ORAL | Status: DC

## 2011-12-31 MED ORDER — DEXTROSE 5 % IV SOLN
1.5000 g | INTRAVENOUS | Status: AC
  Administered 2011-12-31: 1.5 g via INTRAVENOUS
  Administered 2011-12-31: .75 g via INTRAVENOUS
  Filled 2011-12-31: qty 1.5

## 2011-12-31 MED ORDER — SODIUM BICARBONATE 8.4 % IV SOLN
INTRAVENOUS | Status: AC
  Administered 2011-12-31: 17:00:00

## 2011-12-31 MED ORDER — MIDAZOLAM HCL 2 MG/2ML IJ SOLN
INTRAMUSCULAR | Status: AC
  Filled 2011-12-31: qty 2

## 2011-12-31 MED ORDER — POTASSIUM CHLORIDE 10 MEQ/50ML IV SOLN
10.0000 meq | INTRAVENOUS | Status: AC
  Administered 2011-12-31 – 2012-01-01 (×3): 10 meq via INTRAVENOUS
  Filled 2011-12-31: qty 150

## 2011-12-31 MED ORDER — LACTATED RINGERS IV SOLN
INTRAVENOUS | Status: DC | PRN
  Administered 2011-12-31 (×3): via INTRAVENOUS

## 2011-12-31 MED ORDER — ACETAMINOPHEN 500 MG PO TABS
1000.0000 mg | ORAL_TABLET | Freq: Four times a day (QID) | ORAL | Status: DC
Start: 2012-01-01 — End: 2012-01-02
  Administered 2012-01-01 – 2012-01-02 (×5): 1000 mg via ORAL
  Filled 2011-12-31 (×11): qty 2

## 2011-12-31 MED ORDER — DIAZEPAM 5 MG PO TABS
ORAL_TABLET | ORAL | Status: AC
  Administered 2011-12-31: 10 mg via ORAL
  Filled 2011-12-31: qty 2

## 2011-12-31 MED ORDER — PROPOFOL 10 MG/ML IV EMUL
INTRAVENOUS | Status: DC | PRN
  Administered 2011-12-31: 40 mg via INTRAVENOUS

## 2011-12-31 SURGICAL SUPPLY — 118 items
ATTRACTOMAT 16X20 MAGNETIC DRP (DRAPES) ×2 IMPLANT
BAG DECANTER FOR FLEXI CONT (MISCELLANEOUS) ×2 IMPLANT
BANDAGE ELASTIC 4 VELCRO ST LF (GAUZE/BANDAGES/DRESSINGS) ×2 IMPLANT
BANDAGE ELASTIC 6 VELCRO ST LF (GAUZE/BANDAGES/DRESSINGS) ×2 IMPLANT
BANDAGE GAUZE ELAST BULKY 4 IN (GAUZE/BANDAGES/DRESSINGS) ×2 IMPLANT
BLADE MINI RND TIP GREEN BEAV (BLADE) ×2 IMPLANT
BLADE STERNUM SYSTEM 6 (BLADE) ×2 IMPLANT
BLADE SURG 15 STRL LF DISP TIS (BLADE) ×1 IMPLANT
BLADE SURG 15 STRL SS (BLADE) ×1
BLADE SURG ROTATE 9660 (MISCELLANEOUS) ×4 IMPLANT
CANISTER SUCTION 2500CC (MISCELLANEOUS) ×2 IMPLANT
CANN PRFSN .5XCNCT 15X34-48 (MISCELLANEOUS) ×1
CANNULA AORTIC HI-FLOW 6.5M20F (CANNULA) ×2 IMPLANT
CANNULA PRFSN .5XCNCT 15X34-48 (MISCELLANEOUS) ×1 IMPLANT
CANNULA VEN 2 STAGE (MISCELLANEOUS) ×1
CATH CPB KIT GERHARDT (MISCELLANEOUS) ×2 IMPLANT
CATH THORACIC 28FR (CATHETERS) ×2 IMPLANT
CATH THORACIC 36FR (CATHETERS) IMPLANT
CATH THORACIC 36FR RT ANG (CATHETERS) IMPLANT
CLIP TI MEDIUM 24 (CLIP) IMPLANT
CLIP TI WIDE RED SMALL 24 (CLIP) IMPLANT
CLOTH BEACON ORANGE TIMEOUT ST (SAFETY) ×2 IMPLANT
COVER MAYO STAND STRL (DRAPES) ×2 IMPLANT
COVER SURGICAL LIGHT HANDLE (MISCELLANEOUS) ×6 IMPLANT
CRADLE DONUT ADULT HEAD (MISCELLANEOUS) ×2 IMPLANT
DRAIN CHANNEL 28F RND 3/8 FF (WOUND CARE) ×2 IMPLANT
DRAIN CHANNEL 32F RND 10.7 FF (WOUND CARE) IMPLANT
DRAPE CARDIOVASCULAR INCISE (DRAPES) ×1
DRAPE SLUSH MACHINE 52X66 (DRAPES) IMPLANT
DRAPE SLUSH/WARMER DISC (DRAPES) ×2 IMPLANT
DRAPE SRG 135X102X78XABS (DRAPES) ×1 IMPLANT
DRSG COVADERM 4X14 (GAUZE/BANDAGES/DRESSINGS) ×2 IMPLANT
ELECT BLADE 4.0 EZ CLEAN MEGAD (MISCELLANEOUS) ×2
ELECT CAUTERY BLADE 6.4 (BLADE) ×2 IMPLANT
ELECT REM PT RETURN 9FT ADLT (ELECTROSURGICAL) ×4
ELECTRODE BLDE 4.0 EZ CLN MEGD (MISCELLANEOUS) ×1 IMPLANT
ELECTRODE REM PT RTRN 9FT ADLT (ELECTROSURGICAL) ×2 IMPLANT
GLOVE BIO SURGEON STRL SZ 6 (GLOVE) ×8 IMPLANT
GLOVE BIO SURGEON STRL SZ 6.5 (GLOVE) ×12 IMPLANT
GLOVE BIO SURGEON STRL SZ7 (GLOVE) IMPLANT
GLOVE BIO SURGEON STRL SZ7.5 (GLOVE) ×4 IMPLANT
GLOVE BIOGEL PI IND STRL 6 (GLOVE) ×1 IMPLANT
GLOVE BIOGEL PI IND STRL 6.5 (GLOVE) ×2 IMPLANT
GLOVE BIOGEL PI IND STRL 7.0 (GLOVE) ×3 IMPLANT
GLOVE BIOGEL PI INDICATOR 6 (GLOVE) ×1
GLOVE BIOGEL PI INDICATOR 6.5 (GLOVE) ×2
GLOVE BIOGEL PI INDICATOR 7.0 (GLOVE) ×3
GLOVE EUDERMIC 7 POWDERFREE (GLOVE) IMPLANT
GLOVE ORTHO TXT STRL SZ7.5 (GLOVE) IMPLANT
GOWN PREVENTION PLUS XLARGE (GOWN DISPOSABLE) ×6 IMPLANT
GOWN STRL NON-REIN LRG LVL3 (GOWN DISPOSABLE) ×18 IMPLANT
HEMOSTAT POWDER SURGIFOAM 1G (HEMOSTASIS) ×6 IMPLANT
HEMOSTAT SURGICEL 2X14 (HEMOSTASIS) ×4 IMPLANT
INSERT FOGARTY 61MM (MISCELLANEOUS) IMPLANT
INSERT FOGARTY XLG (MISCELLANEOUS) IMPLANT
KIT BASIN OR (CUSTOM PROCEDURE TRAY) ×2 IMPLANT
KIT ROOM TURNOVER OR (KITS) ×2 IMPLANT
KIT SUCTION CATH 14FR (SUCTIONS) ×4 IMPLANT
KIT VASOVIEW W/TROCAR VH 2000 (KITS) ×2 IMPLANT
LEAD PACING MYOCARDI (MISCELLANEOUS) ×2 IMPLANT
MARKER GRAFT CORONARY BYPASS (MISCELLANEOUS) ×12 IMPLANT
NS IRRIG 1000ML POUR BTL (IV SOLUTION) ×12 IMPLANT
PACK OPEN HEART (CUSTOM PROCEDURE TRAY) ×2 IMPLANT
PAD ARMBOARD 7.5X6 YLW CONV (MISCELLANEOUS) ×4 IMPLANT
PENCIL BUTTON HOLSTER BLD 10FT (ELECTRODE) ×2 IMPLANT
PUNCH AORTIC ROTATE 4.0MM (MISCELLANEOUS) ×2 IMPLANT
PUNCH AORTIC ROTATE 4.5MM 8IN (MISCELLANEOUS) IMPLANT
PUNCH AORTIC ROTATE 5MM 8IN (MISCELLANEOUS) IMPLANT
SENSOR MYOCARDIAL TEMP (MISCELLANEOUS) ×2 IMPLANT
SET CARDIOPLEGIA MPS 5001102 (MISCELLANEOUS) ×2 IMPLANT
SOLUTION ANTI FOG 6CC (MISCELLANEOUS) IMPLANT
SPONGE GAUZE 4X4 12PLY (GAUZE/BANDAGES/DRESSINGS) ×4 IMPLANT
SPONGE LAP 18X18 X RAY DECT (DISPOSABLE) ×6 IMPLANT
SPONGE LAP 4X18 X RAY DECT (DISPOSABLE) IMPLANT
SUT BONE WAX W31G (SUTURE) ×2 IMPLANT
SUT MNCRL AB 4-0 PS2 18 (SUTURE) IMPLANT
SUT PROLENE 3 0 SH DA (SUTURE) IMPLANT
SUT PROLENE 3 0 SH1 36 (SUTURE) ×2 IMPLANT
SUT PROLENE 4 0 RB 1 (SUTURE)
SUT PROLENE 4 0 SH DA (SUTURE) IMPLANT
SUT PROLENE 4 0 TF (SUTURE) ×4 IMPLANT
SUT PROLENE 4-0 RB1 .5 CRCL 36 (SUTURE) IMPLANT
SUT PROLENE 5 0 C 1 36 (SUTURE) IMPLANT
SUT PROLENE 6 0 C 1 30 (SUTURE) ×4 IMPLANT
SUT PROLENE 6 0 CC (SUTURE) ×6 IMPLANT
SUT PROLENE 7 0 BV 1 (SUTURE) IMPLANT
SUT PROLENE 7 0 BV1 MDA (SUTURE) ×4 IMPLANT
SUT PROLENE 7.0 RB 3 (SUTURE) IMPLANT
SUT PROLENE 8 0 BV175 6 (SUTURE) ×20 IMPLANT
SUT SILK  1 MH (SUTURE)
SUT SILK 1 MH (SUTURE) IMPLANT
SUT SILK 2 0 SH CR/8 (SUTURE) IMPLANT
SUT SILK 3 0 SH CR/8 (SUTURE) IMPLANT
SUT STEEL 6MS V (SUTURE) ×2 IMPLANT
SUT STEEL STERNAL CCS#1 18IN (SUTURE) IMPLANT
SUT STEEL SZ 6 DBL 3X14 BALL (SUTURE) ×2 IMPLANT
SUT VIC AB 1 CTX 18 (SUTURE) ×4 IMPLANT
SUT VIC AB 1 CTX 36 (SUTURE)
SUT VIC AB 1 CTX36XBRD ANBCTR (SUTURE) IMPLANT
SUT VIC AB 2-0 CT1 27 (SUTURE) ×1
SUT VIC AB 2-0 CT1 TAPERPNT 27 (SUTURE) ×1 IMPLANT
SUT VIC AB 2-0 CTX 27 (SUTURE) IMPLANT
SUT VIC AB 3-0 SH 27 (SUTURE)
SUT VIC AB 3-0 SH 27X BRD (SUTURE) IMPLANT
SUT VIC AB 3-0 X1 27 (SUTURE) ×2 IMPLANT
SUT VICRYL 4-0 PS2 18IN ABS (SUTURE) IMPLANT
SUTURE E-PAK OPEN HEART (SUTURE) ×2 IMPLANT
SYSTEM SAHARA CHEST DRAIN ATS (WOUND CARE) ×2 IMPLANT
TAPE CLOTH SURG 4X10 WHT LF (GAUZE/BANDAGES/DRESSINGS) ×2 IMPLANT
TOWEL OR 17X24 6PK STRL BLUE (TOWEL DISPOSABLE) ×4 IMPLANT
TOWEL OR 17X26 10 PK STRL BLUE (TOWEL DISPOSABLE) ×4 IMPLANT
TRAY FOLEY IC TEMP SENS 14FR (CATHETERS) ×2 IMPLANT
TUBE FEEDING 8FR 16IN STR KANG (MISCELLANEOUS) ×2 IMPLANT
TUBE SUCT INTRACARD DLP 20F (MISCELLANEOUS) ×2 IMPLANT
TUBING INSUFFLATION 10FT LAP (TUBING) ×2 IMPLANT
UNDERPAD 30X30 INCONTINENT (UNDERPADS AND DIAPERS) ×2 IMPLANT
WATER STERILE IRR 1000ML POUR (IV SOLUTION) ×4 IMPLANT
YANKAUER SUCT BULB TIP NO VENT (SUCTIONS) ×2 IMPLANT

## 2011-12-31 NOTE — Preoperative (Signed)
Beta Blockers   Reason not to administer Beta Blockers:Hold  beta blocker due to Bradycardia (HR less than 50 bpm), bradycardia

## 2011-12-31 NOTE — Anesthesia Postprocedure Evaluation (Signed)
  Anesthesia Post-op Note  Patient: Lance Trujillo  Procedure(s) Performed: Procedure(s) (LRB): CORONARY ARTERY BYPASS GRAFTING (CABG) (N/A)  Patient Location: SICU  Anesthesia Type: General  Level of Consciousness: sedated and Patient remains intubated per anesthesia plan  Airway and Oxygen Therapy: Patient remains intubated per anesthesia plan  Post-op Pain: none  Post-op Assessment: Post-op Vital signs reviewed  Post-op Vital Signs: Reviewed  Complications: No apparent anesthesia complications

## 2011-12-31 NOTE — Brief Op Note (Addendum)
                   [address]            [place] [address]          [phone]    12/31/2011  7:33 PM  PATIENT:  Lance Trujillo  76 y.o. male  PRE-OPERATIVE DIAGNOSIS:  CAD with left main 95 %  POST-OPERATIVE DIAGNOSIS:  same  PROCEDURE:  Procedure(s):EMERGENCY CORONARY ARTERY BYPASS GRAFTING (CABG)X5 LIMA-LAD; SEQ SVG-AM-PD-DISTCX; SVG-OM EVH LEFT LEG (RIGHT SVG-SMALL-EXPLORED AT KNEE)  SURGEON:  Surgeon(s): Delight Ovens, MD  PHYSICIAN ASSISTANT: WAYNE GOLD PA-C  ANESTHESIA:   general  PATIENT CONDITION:  ICU - intubated and hemodynamically stable.  PRE-OPERATIVE WEIGHT: 72kg  COMPLICATIONS: NONE     floey, sg aline left chest tube and mediastinal tube 2 a 2 v wires

## 2011-12-31 NOTE — Transfer of Care (Signed)
Immediate Anesthesia Transfer of Care Note  Patient: Lance Trujillo  Procedure(s) Performed: Procedure(s) (LRB): CORONARY ARTERY BYPASS GRAFTING (CABG) (N/A)  Patient Location: PACU and SICU  Anesthesia Type: General  Level of Consciousness: sedated  Airway & Oxygen Therapy: Patient remains intubated per anesthesia plan and Patient placed on Ventilator (see vital sign flow sheet for setting)  Post-op Assessment: Report given to PACU RN and Post -op Vital signs reviewed and stable  Post vital signs: Reviewed and stable  Complications: No apparent anesthesia complications

## 2011-12-31 NOTE — Consult Note (Signed)
[address]            [place] [address]          [phone]       Lance Trujillo [facility] Medical Record #[id number] Date of Birth: [birth year]  Referring: Dr Myna Hidalgo Primary Care: Pcp Not In System  Chief Complaint:   Anginal Chest Pain  History of Present Illness:     Lance Trujillo had cardiac cath today by Dr Estill Cotta.  He has had HTN x 10 years and HLD. No known heart disease. Had remote stress testing prior to his surgery for prostate cancer.   He reports at least a one year history of DOE. This is progressively getting worse. He is now almost unable to cut his grass or take his garbage can to the street. For the past couple of months, he has had an exertional tightness in his chest that resolves with rest. He has had no symptoms at rest. He notes that his symptoms seem to be getting worse and more frequent, certainly not going away. He has a chronic bradycardia. No known DM. Family history is strikingly positive for CAD. Former smoker. No regular exercise but tries to stay busy. Family found out about his symptoms. He tried to get in with cardiology at [facility] since he is a former employee but was told he could not be seen until 2014. He does not check his blood pressure at home. Saw his PCP about 3 months ago and it was "ok".     Past Medical History  Diagnosis Date  . Prostate cancer     s/p robotic prostatectomy per Dr. Laverle Patter  . HTN (hypertension)   . HLD (hyperlipidemia)   . Bradycardia     Past Surgical History  Procedure Date  . Prostatectomy   . Cataract extraction     History  Smoking status  . Former Smoker  . Types: Cigarettes  . Quit date: 12/29/1981  Smokeless tobacco  . Never Used    History  Alcohol Use  . Yes    occasional, formerly heavy intermittent use    History   Social History  . Marital Status: Married    Spouse Name: N/A    Number of Children: N/A  . Years of Education: N/A   Occupational  History  . 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    Social History Main Topics  . Smoking status: Former Smoker    Types: Cigarettes    Quit date: 12/29/1981  . Smokeless tobacco: Never Used  . Alcohol Use: Yes     occasional, formerly heavy intermittent use                      Allergies  Allergen Reactions  . Clindamycin/Lincomycin     Current Facility-Administered Medications  Medication Dose Route Frequency Provider Last Rate Last Dose  . 0.9 %  sodium chloride infusion  250 mL Intravenous PRN Rosalio Macadamia, NP      . 0.9 %  sodium chloride infusion   Intravenous Continuous Rosalio Macadamia, NP 10 mL/hr at 12/31/11 1110    . aspirin chewable tablet 324 mg  324 mg Oral Pre-Cath Rosalio Macadamia, NP   324 mg at 12/31/11 1114  . cefUROXime (ZINACEF)  1.5 g in dextrose 5 % 50 mL IVPB  1.5 g Intravenous To OR [name] [place], [place]      . cefUROXime (ZINACEF) 750 mg in dextrose 5 % 50 mL IVPB  750 mg Intravenous To OR [name] [place], Trujillo      . dexmedetomidine (PRECEDEX) 400 mcg / 100 mL infusion  0.1-0.7 mcg/kg/hr Intravenous To OR [name] [place], Tru[place]      . diazepam (VALIUM) tablet 10 mg  10 mg Oral On Call Rosalio Macadamia, NP   10 mg at 12/31/11 1114  . DOPamine (INTROPIN) 800 mg in dextrose 5 % 250 mL infusion  2-20 mcg/kg/min Intravenous To OR [name] [place], Trujillo      . EPINEPHrine (ADRENALIN) 4,000 mcg in dextrose 5 % 250 mL infusion  0.5-20 mcg/min Intravenous To OR [name] [place], Tru[place]      . fentaNYL (SUBLIMAZE) 0.05 MG/ML injection           . heparin 1000 UNIT/ML injection           . insulin regular (NOVOLIN R,HUMULIN R) 1 Units/mL in sodium chloride 0.9 % 100 mL infusion   Intravenous To OR [name] [place], Tru[place]      . magnesium sulfate (IV Push/IM) injection 40 mEq  40 mEq Other To OR [name] [place], Tru[place]      . midazolam (VERSED) 2 MG/2ML injection           . nitroGLYCERIN 0.2 mg/mL in dextrose 5 % infusion  2-200  mcg/min Intravenous To OR [name] [place], [place]      . nitroglycerin-nicardipine-HEPARIN-sodium bicarbonate irrigation for artery spasm   Irrigation To OR [name] [place], [place]      . phenylephrine (NEO-SYNEPHRINE) 20,000 mcg in dextrose 5 % 250 mL infusion  30-200 mcg/min Intravenous To OR [name] [place], Tru[place]      . potassium chloride injection 80 mEq  80 mEq Other To OR [name] [place], Tru[place]      . sodium chloride 0.9 % injection 3 mL  3 mL Intravenous Q12H Rosalio Macadamia, NP      . sodium chloride 0.9 % injection 3 mL  3 mL Intravenous PRN Rosalio Macadamia, NP      . tranexamic acid (CYKLOKAPRON) bolus via infusion - over 30 minutes 1,081.5 mg  15 mg/kg Intravenous To OR [name] [place], Tru[place]      . tranexamic acid (CYKLOKAPRON) pump prime solution 144 mg  2 mg/kg Intracatheter To OR [name] [place], Tru[place]      . tranexamic acid (CYKLOLAPRON) 2,500 mg in sodium chloride 0.9 % 250 mL infusion  1.5 mg/kg/hr Intravenous To OR [name] [place], Truj[place]      . vancomycin (VANCOCIN) 1,250 mg in sodium chloride 0.9 % 250 mL IVPB  1,250 mg Intravenous To OR [name] [place], Tru[place]      . verapamil (ISOPTIN) 2.5 MG/ML injection             Prescriptions prior to admission  Medication Sig Dispense Refill  . aspirin 81 MG EC tablet Take 1 tablet (81 mg total) by mouth daily. Swallow whole.  30 tablet  12  . atorvastatin (LIPITOR) 10 MG tablet Take 10 mg by mouth daily.      . hydrochlorothiazide (HYDRODIURIL) 50 MG tablet Take 50 mg by mouth daily.      . metoprolol succinate (TOPROL XL) 25 MG 24 hr tablet Take 12.5 mg by mouth  daily.      . nitroGLYCERIN (NITROSTAT) 0.4 MG SL tablet Place 1 tablet (0.4 mg total) under the tongue every 5 (five) minutes as needed for chest pain.  25 tablet  3    No family history on file.   Review of Systems:     Cardiac Review of Systems: Y or N  Chest Pain [  y  ]  Resting SOB [ n  ] Exertional SOB  Cove.Etienne  ]  Orthopnea [ n ]   Pedal Edema [ n  ]    Palpitations [n  ] Syncope  n[  ]     Presyncope [ n  ]  General Review of Systems: [Y] = yes [  ]=no Constitional: recent weight change [  n]; anorexia [  ]; fatigue [  ]; nausea [n  ]; night sweats [  ]; fever [  ]; or chills [  ];                                                                                                                                          Dental: poor dentition[n  ];   Eye : blurred vision [n  ]; diplopia [   ]; vision changes [  ];  Amaurosis fugax[  ]; Resp: cough [  ];  wheezing[  ];  hemoptysis[  ]; shortness of breath[  ]; paroxysmal nocturnal dyspnea[  ]; dyspnea on exertion[  ]; or orthopnea[  ];  GI:  gallstones[  ], vomiting[  ];  dysphagia[  ]; melena[  ];  hematochezia [  ]; heartburn[  ];   Hx of  Colonoscopy[  ]; GU: kidney stones [  ]; hematuria[  ];   dysuria [  ];  nocturia[  ];  history of     obstruction [  ];             Skin: rash, swelling[  ];, hair loss[  ];  peripheral edema[  ];  or itching[  ]; Musculosketetal: myalgias[  ];  joint swelling[  ];  joint erythema[  ];  joint pain[  ];  back pain[  ];  Heme/Lymph: bruising[  ];  bleeding[  ];  anemia[  ];  Neuro: TIA[  ];  headaches[  ];  stroke[  ];  vertigo[  ];  seizures[  ];   paresthesias[  ];  difficulty walking[  ];  Psych:depression[  ]; anxiety[  ];  Endocrine: diabetes[  ];  thyroid dysfunction[  ];  Immunizations: Flu [?  ]; Pneumococcal[?  ];  Other:  Physical Exam: BP 146/82  Pulse 63  Temp 96.7 F (35.9 C) (Oral)  Resp 18  Ht 5\' 6"  (1.676 m)  Wt 159 lb (72.122 kg)  BMI 25.66 kg/m2  SpO2 97%  General appearance: alert, cooperative, appears stated age and no distress Neurologic: intact Heart: regular rate and  rhythm, S1, S2 normal, no murmur, click, rub or gallop and normal apical impulse Lungs: clear to auscultation bilaterally and normal percussion bilaterally Abdomen: soft, non-tender; bowel sounds normal; no masses,  no organomegaly Extremities: extremities normal, atraumatic, no cyanosis or edema and  Homans sign is negative, no sign of DVT Wound: rt wrist cath site ok no carotid bruits   Diagnostic Studies & Laboratory data:     Recent Radiology Findings:   Dg Chest 2 View  12/30/2011  *RADIOLOGY REPORT*  Clinical Data: Chest pain, short of breath, pre cardiac catheterization  CHEST - 2 VIEW  Comparison: Chest x-ray of 12/23/2007  Findings: The lungs are clear remains somewhat hyperaerated. Mediastinal contours appear stable.  The heart is within normal limits in size.  There are degenerative changes throughout the thoracic spine.  IMPRESSION: Stable chest x-ray.  No active lung disease.  Original Report Authenticated By: Juline Patch, M.D.      Recent Lab Findings: Lab Results  Component Value Date   WBC 5.7 12/30/2011   HGB 16.8 12/30/2011   HCT 50.2 12/30/2011   PLT 207.0 12/30/2011   GLUCOSE 84 12/30/2011   CHOL 154 12/30/2011   TRIG 114.0 12/30/2011   HDL 45.00 12/30/2011   LDLCALC 86 12/30/2011   ALT 32 12/30/2011   AST 28 12/30/2011   NA 137 12/30/2011   K 4.1 12/30/2011   CL 100 12/30/2011   CREATININE 1.0 12/30/2011   BUN 9 12/30/2011   CO2 29 12/30/2011   INR 1.0 12/30/2011   Name: Lance Trujillo  MRN: [id number]  DOB: 12-22-1935  Procedure: Left Heart Cath, Selective Coronary Angiography, LV angiography  Indication: 76 year old white male with history of hypertension and hyperlipidemia who presents with progressive symptoms of dyspnea and chest pain initially on exertion but now at rest.  Procedural Details: The right wrist was prepped, draped, and anesthetized with 1% lidocaine. Using the modified Seldinger technique, a 5 French sheath was introduced into the right radial artery. 3 mg of verapamil was administered through the sheath, weight-based unfractionated heparin was administered intravenously. Standard Judkins catheters were used for selective coronary angiography and left ventriculography. Catheter exchanges were performed over an exchange length guidewire. There were no immediate procedural  complications. A TR band was used for radial hemostasis at the completion of the procedure. The patient was transferred to the post catheterization recovery area for further monitoring.  Procedural Findings:  Hemodynamics:  AO 117/58 with a mean of 82 mmHg  LV 122/9 mmHg  Coronary angiography:  Coronary dominance: Codominant  Left mainstem: There is a 40% narrowing of the ostial left main. The distal left main has a 95% stenosis.  Left anterior descending (LAD): The left anterior descending artery is a large vessel that wraps around the apex. The proximal vessel is heavily calcified. There is diffuse proximal disease up to 70% with a more focal area of 95% stenosis. The distal LAD has diffuse 60% disease.  Left circumflex (LCx): The left circumflex is a codominant vessel. It is moderately calcified. There is diffuse 30-40% disease throughout the proximal and mid vessel. The first obtuse marginal vessel has diffuse 60% disease proximally and then more focal 90% stenosis in the mid vessel at the bifurcation. The terminal posterior lateral branch is diffusely diseased and small with up to 90% stenosis.  Right coronary artery (RCA): The right coronary was difficult to engage with the catheter. There is a 90% ostial stenosis. The right coronary appears to be severely diffusely diseased  throughout the mid vessel and then is occluded at the crux.  Left ventriculography: Left ventricular systolic function is normal, LVEF is estimated at 55-65%, there is no significant mitral regurgitation  Final Conclusions:  1. Critical left main and three-vessel coronary disease.  2. Normal left ventricular function.  Recommendations: Patient be admitted to the coronary intensive care unit. We'll anticoagulate with heparin. CT surgery consult will be obtained for bypass surgery. Currently patient is hemodynamically stable and pain-free.  Lance Trujillo  12/31/2011, 1:14 PM    Assessment / Plan:      With  increasing symptoms of ischemia and ostial rca and very high grade lt main have recommended proceeding with CABG Dr [place] in agreement The goals risks and alternatives of the planned surgical procedure Urgent  have been discussed with the patient in detail. The risks of the procedure including death, infection, stroke, myocardial infarction, bleeding, blood transfusion have all been discussed specifically.  I have quoted Lance Trujillo a 2 % of perioperative mortality and a complication rate as high as 15 %. The patient's questions have been answered.Lance Trujillo is willing  to proceed with the planned procedure.      Lance Trujillo  Beeper [phone] Office [phone] 12/31/2011 2:10 PM

## 2011-12-31 NOTE — Interval H&P Note (Signed)
History and Physical Interval Note:  12/31/2011 12:20 PM  Lance Trujillo  has presented today for surgery, with the diagnosis of chest pain  The various methods of treatment have been discussed with the patient and family. After consideration of risks, benefits and other options for treatment, the patient has consented to  Procedure(s) (LRB): LEFT HEART CATHETERIZATION WITH CORONARY ANGIOGRAM (N/A) as a surgical intervention .  The patient's history has been reviewed, patient examined, no change in status, stable for surgery.  I have reviewed the patient's chart and labs.  Questions were answered to the patient's satisfaction.     Theron Arista [facility] 12/31/2011 12:20 PM

## 2011-12-31 NOTE — CV Procedure (Signed)
   Cardiac Catheterization Procedure Note  Name: Lance Trujillo MRN: [id number] DOB: 07-08-1935  Procedure: Left Heart Cath, Selective Coronary Angiography, LV angiography  Indication: 76 year old white male with history of hypertension and hyperlipidemia who presents with progressive symptoms of dyspnea and chest pain initially on exertion but now at rest.   Procedural Details: The right wrist was prepped, draped, and anesthetized with 1% lidocaine. Using the modified Seldinger technique, a 5 French sheath was introduced into the right radial artery. 3 mg of verapamil was administered through the sheath, weight-based unfractionated heparin was administered intravenously. Standard Judkins catheters were used for selective coronary angiography and left ventriculography. Catheter exchanges were performed over an exchange length guidewire. There were no immediate procedural complications. A TR band was used for radial hemostasis at the completion of the procedure.  The patient was transferred to the post catheterization recovery area for further monitoring.  Procedural Findings: Hemodynamics: AO 117/58 with a mean of 82 mmHg LV 122/9 mmHg  Coronary angiography: Coronary dominance: Codominant  Left mainstem: There is a 40% narrowing of the ostial left main. The distal left main has a 95% stenosis.  Left anterior descending (LAD): The left anterior descending artery is a large vessel that wraps around the apex. The proximal vessel is heavily calcified. There is diffuse proximal disease up to 70% with a more focal area of 95% stenosis. The distal LAD has diffuse 60% disease.  Left circumflex (LCx): The left circumflex is a codominant vessel. It is moderately calcified. There is diffuse 30-40% disease throughout the proximal and mid vessel. The first obtuse marginal vessel has diffuse 60% disease proximally and then more focal 90% stenosis in the mid vessel at the bifurcation. The terminal posterior  lateral branch is diffusely diseased and small with up to 90% stenosis.  Right coronary artery (RCA): The right coronary was difficult to engage with the catheter. There is a 90% ostial stenosis. The right coronary appears to be severely diffusely diseased throughout the mid vessel and then is occluded at the crux.  Left ventriculography: Left ventricular systolic function is normal, LVEF is estimated at 55-65%, there is no significant mitral regurgitation   Final Conclusions:   1. Critical left main and three-vessel coronary disease. 2. Normal left ventricular function.  Recommendations: Patient be admitted to the coronary intensive care unit. We'll anticoagulate with heparin. CT surgery consult will be obtained for bypass surgery. Currently patient is hemodynamically stable and pain-free.  Theron Arista [facility] 12/31/2011, 1:14 PM

## 2011-12-31 NOTE — Progress Notes (Signed)
VASCULAR LAB PRELIMINARY  PRELIMINARY  PRELIMINARY  PRELIMINARY  Pre-op Cardiac Surgery  Carotid Findings:  No evidence of significant ICA stenosis. Vertebral artery flow is antegrade bilaterally.  Upper Extremity Right Left  Brachial Pressures    Radial Waveforms    Ulnar Waveforms    Palmar Arch (Allen's Test)     Findings:  Emergency surgery not needed per Dr. Tyrone Sage.    Lower  Extremity Right Left  Dorsalis Pedis    Anterior Tibial    Posterior Tibial    Ankle/Brachial Indices      Findings:  Emergency surgery Not needed needed per Dr. Tyrone Sage.   Lance Trujillo, RVS 12/31/2011, 2:11 PM

## 2011-12-31 NOTE — Progress Notes (Signed)
  Echocardiogram Echocardiogram Transesophageal has been performed.  Lance Trujillo 12/31/2011, 4:06 PM

## 2011-12-31 NOTE — H&P (View-Only) (Signed)
Lance Trujillo Date of Birth: 1935-11-19 Medical Record #[id number]  History of Present Illness: Lance Trujillo is seen today for a new patient visit. He is seen for Lance Trujillo. He has seen Lance Trujillo approximately 3 to 4 years ago. Those records are not available. He is Lance Trujillo father. He has had HTN x 10 years and HLD. No known heart disease. Had remote stress testing prior to his surgery for prostate cancer. 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   He comes in today. He is here with his son. He reports at least a one year history of DOE. This is progressively getting worse. He is now almost unable to cut his grass or take his garbage can to the street. For the past couple of months, he has had an exertional tightness in his chest that resolves with rest. He has had no symptoms at rest. He notes that his symptoms seem to be getting worse and more frequent, certainly not going away.  He has a chronic bradycardia. No known DM. Family history is strikingly positive for CAD. Former smoker. No regular exercise but tries to stay busy. Family found out about his symptoms. He tried to get in with cardiology at [facility] since he is a former employee but was told he could not be seen until 2014. He does not check his blood pressure at home. Saw his PCP about 3 months ago and it was "ok".   Current Outpatient Prescriptions on File Prior to Visit  Medication Sig Dispense Refill  . atorvastatin (LIPITOR) 10 MG tablet Take 10 mg by mouth daily.      . hydrochlorothiazide (HYDRODIURIL) 50 MG tablet Take 50 mg by mouth daily.      . metoprolol succinate (TOPROL XL) 25 MG 24 hr tablet Take 12.5 mg by mouth daily.      . nitroGLYCERIN (NITROSTAT) 0.4 MG SL tablet Place 1 tablet (0.4 mg total) under the tongue every 5 (five) minutes as needed for chest pain.  25 tablet  3    Allergies  Allergen Reactions  . Clindamycin/Lincomycin     Past Medical History  Diagnosis Date  . Prostate  cancer     s/p robotic prostatectomy per Lance Trujillo  . HTN (hypertension)   . HLD (hyperlipidemia)   . Bradycardia     Past Surgical History  Procedure Date  . Prostatectomy   . Cataract extraction     History  Smoking status  . Former Smoker  . Types: Cigarettes  . Quit date: 12/29/1981  Smokeless tobacco  . Never Used    History  Alcohol Use  . Yes    occasional, formerly heavy intermittent use    History reviewed. No pertinent family history.  Review of Systems: The review of systems is per the HPI.  All other systems were reviewed and are negative.  Physical Exam: BP 172/78  Pulse 45  Ht 5\' 6"  (1.676 m)  Wt 159 lb 1.9 oz (72.176 kg)  BMI 25.68 kg/m2 Patient is very pleasant and in no acute distress. Skin is warm and dry. Color is normal.  HEENT is unremarkable. Normocephalic/atraumatic. PERRL. Sclera are nonicteric. Neck is supple. No masses. No JVD. Lungs show decreased breath sounds. Cardiac exam shows a regular rate and rhythm, yet slow. Abdomen is soft. Extremities are without edema. Gait and ROM are intact. No gross neurologic deficits noted.   LABORATORY DATA: EKG shows sinus bradycardia.  No acute changes. Tracing was reviewed with Lance Trujillo today (DOD).   Other labs are pending. CXR is pending.    Assessment / Plan:

## 2011-12-31 NOTE — Anesthesia Preprocedure Evaluation (Addendum)
Anesthesia Evaluation  Patient identified by MRN, date of birth, ID band Patient awake    Reviewed: Allergy & Precautions, H&P , NPO status , Patient's Chart, lab work & pertinent test results, reviewed documented beta blocker date and time   Airway Mallampati: I TM Distance: >3 FB Neck ROM: Full    Dental  (+) Teeth Intact and Dental Advisory Given   Pulmonary  breath sounds clear to auscultation        Cardiovascular hypertension, Pt. on medications Rhythm:Regular Rate:Bradycardia     Neuro/Psych    GI/Hepatic   Endo/Other    Renal/GU      Musculoskeletal   Abdominal   Peds  Hematology   Anesthesia Other Findings   Reproductive/Obstetrics                           Anesthesia Physical Anesthesia Plan  ASA: III and Emergent  Anesthesia Plan: General   Post-op Pain Management:    Induction: Intravenous  Airway Management Planned: Oral ETT  Additional Equipment: Arterial line, CVP, PA Cath and TEE  Intra-op Plan:   Post-operative Plan: Post-operative intubation/ventilation  Informed Consent: I have reviewed the patients History and Physical, chart, labs and discussed the procedure including the risks, benefits and alternatives for the proposed anesthesia with the patient or authorized representative who has indicated his/her understanding and acceptance.     Plan Discussed with: CRNA and Surgeon  Anesthesia Plan Comments:         Anesthesia Quick Evaluation

## 2012-01-01 ENCOUNTER — Other Ambulatory Visit: Payer: Self-pay

## 2012-01-01 ENCOUNTER — Encounter (HOSPITAL_COMMUNITY): Payer: Self-pay | Admitting: *Deleted

## 2012-01-01 ENCOUNTER — Encounter (HOSPITAL_COMMUNITY): Payer: Medicare Other

## 2012-01-01 ENCOUNTER — Inpatient Hospital Stay (HOSPITAL_COMMUNITY): Payer: Medicare Other

## 2012-01-01 DIAGNOSIS — Z01810 Encounter for preprocedural cardiovascular examination: Secondary | ICD-10-CM

## 2012-01-01 DIAGNOSIS — I2510 Atherosclerotic heart disease of native coronary artery without angina pectoris: Secondary | ICD-10-CM | POA: Diagnosis present

## 2012-01-01 LAB — MRSA PCR SCREENING: MRSA by PCR: NEGATIVE

## 2012-01-01 LAB — CBC
HCT: 38.6 % — ABNORMAL LOW (ref 39.0–52.0)
HCT: 38.4 % — ABNORMAL LOW (ref 39.0–52.0)
Hemoglobin: 13.6 g/dL (ref 13.0–17.0)
MCH: 28.9 pg (ref 26.0–34.0)
MCHC: 35.2 g/dL (ref 30.0–36.0)
MCV: 82 fL (ref 78.0–100.0)
Platelets: 136 10*3/uL — ABNORMAL LOW (ref 150–400)
Platelets: 131 10*3/uL — ABNORMAL LOW (ref 150–400)
RBC: 4.71 MIL/uL (ref 4.22–5.81)
RBC: 4.69 MIL/uL (ref 4.22–5.81)
RDW: 13.3 % (ref 11.5–15.5)
RDW: 13.5 % (ref 11.5–15.5)
WBC: 10.4 10*3/uL (ref 4.0–10.5)
WBC: 11.6 10*3/uL — ABNORMAL HIGH (ref 4.0–10.5)

## 2012-01-01 LAB — POCT I-STAT 3, ART BLOOD GAS (G3+)
Bicarbonate: 21.8 mEq/L (ref 20.0–24.0)
Bicarbonate: 22.7 mEq/L (ref 20.0–24.0)
TCO2: 23 mmol/L (ref 0–100)
TCO2: 24 mmol/L (ref 0–100)
pCO2 arterial: 29.3 mmHg — ABNORMAL LOW (ref 35.0–45.0)
pCO2 arterial: 38.5 mmHg (ref 35.0–45.0)
pH, Arterial: 7.469 — ABNORMAL HIGH (ref 7.350–7.450)
pH, Arterial: 7.378 (ref 7.350–7.450)
pO2, Arterial: 102 mmHg — ABNORMAL HIGH (ref 80.0–100.0)
pO2, Arterial: 116 mmHg — ABNORMAL HIGH (ref 80.0–100.0)

## 2012-01-01 LAB — BASIC METABOLIC PANEL
BUN: 7 mg/dL (ref 6–23)
CO2: 23 mEq/L (ref 19–32)
Calcium: 7.3 mg/dL — ABNORMAL LOW (ref 8.4–10.5)
Chloride: 104 mEq/L (ref 96–112)
Creatinine, Ser: 0.76 mg/dL (ref 0.50–1.35)
GFR calc Af Amer: >90 mL/min (ref >90)
GFR calc non Af Amer: 88 mL/min — ABNORMAL LOW (ref >90)
Glucose, Bld: 167 mg/dL — ABNORMAL HIGH (ref 70–99)
Potassium: 3.7 mEq/L (ref 3.5–5.1)
Sodium: 135 mEq/L (ref 135–145)

## 2012-01-01 LAB — CREATININE, SERUM
GFR calc Af Amer: >90 mL/min (ref >90)
GFR calc non Af Amer: 87 mL/min — ABNORMAL LOW (ref >90)

## 2012-01-01 LAB — GLUCOSE, CAPILLARY
Glucose-Capillary: 109 mg/dL — ABNORMAL HIGH (ref 70–99)
Glucose-Capillary: 87 mg/dL (ref 70–99)
Glucose-Capillary: 124 mg/dL — ABNORMAL HIGH (ref 70–99)
Glucose-Capillary: 137 mg/dL — ABNORMAL HIGH (ref 70–99)
Glucose-Capillary: 114 mg/dL — ABNORMAL HIGH (ref 70–99)
Glucose-Capillary: 119 mg/dL — ABNORMAL HIGH (ref 70–99)

## 2012-01-01 LAB — MAGNESIUM
Magnesium: 2.5 mg/dL (ref 1.5–2.5)
Magnesium: 2.1 mg/dL (ref 1.5–2.5)

## 2012-01-01 LAB — POCT I-STAT, CHEM 8
BUN: 8 mg/dL (ref 6–23)
Calcium, Ion: 1.08 mmol/L — ABNORMAL LOW (ref 1.13–1.30)
Chloride: 98 mEq/L (ref 96–112)
Creatinine, Ser: 1 mg/dL (ref 0.50–1.35)
Glucose, Bld: 128 mg/dL — ABNORMAL HIGH (ref 70–99)

## 2012-01-01 MED ORDER — INSULIN ASPART 100 UNIT/ML ~~LOC~~ SOLN
0.0000 [IU] | SUBCUTANEOUS | Status: AC
  Administered 2012-01-01 (×2): 2 [IU] via SUBCUTANEOUS

## 2012-01-01 MED ORDER — INSULIN ASPART 100 UNIT/ML ~~LOC~~ SOLN
0.0000 [IU] | Freq: Three times a day (TID) | SUBCUTANEOUS | Status: DC
  Administered 2012-01-01 – 2012-01-02 (×3): 2 [IU] via SUBCUTANEOUS

## 2012-01-01 MED ORDER — POTASSIUM CHLORIDE 10 MEQ/50ML IV SOLN
10.0000 meq | INTRAVENOUS | Status: AC | PRN
  Administered 2012-01-01 (×3): 10 meq via INTRAVENOUS
  Filled 2012-01-01: qty 150

## 2012-01-01 MED ORDER — POTASSIUM CHLORIDE 10 MEQ/50ML IV SOLN
10.0000 meq | INTRAVENOUS | Status: DC | PRN
  Administered 2012-01-01 (×2): 10 meq via INTRAVENOUS

## 2012-01-01 MED ORDER — FUROSEMIDE 10 MG/ML IJ SOLN: 20.0000 mg | Freq: Once | INTRAMUSCULAR | Status: DC

## 2012-01-01 MED ORDER — FUROSEMIDE 10 MG/ML IJ SOLN
40.0000 mg | Freq: Once | INTRAMUSCULAR | Status: AC
  Administered 2012-01-01: 40 mg via INTRAVENOUS

## 2012-01-01 MED ORDER — INSULIN ASPART 100 UNIT/ML ~~LOC~~ SOLN: 0.0000 [IU] | SUBCUTANEOUS | Status: DC

## 2012-01-01 MED ORDER — POTASSIUM CHLORIDE CRYS ER 10 MEQ PO TBCR
10.0000 meq | EXTENDED_RELEASE_TABLET | Freq: Once | ORAL | Status: AC
  Administered 2012-01-01: 10 meq via ORAL
  Filled 2012-01-01: qty 1

## 2012-01-01 MED FILL — Mannitol IV Soln 20%: INTRAVENOUS | Qty: 500 | Status: AC

## 2012-01-01 MED FILL — Sodium Chloride IV Soln 0.9%: INTRAVENOUS | Qty: 1000 | Status: AC

## 2012-01-01 MED FILL — Electrolyte-R (PH 7.4) Solution: INTRAVENOUS | Qty: 2000 | Status: AC

## 2012-01-01 MED FILL — Lidocaine HCl IV Inj 20 MG/ML: INTRAVENOUS | Qty: 5 | Status: AC

## 2012-01-01 MED FILL — Sodium Bicarbonate IV Soln 8.4%: INTRAVENOUS | Qty: 50 | Status: AC

## 2012-01-01 MED FILL — Heparin Sodium (Porcine) Inj 1000 Unit/ML: INTRAMUSCULAR | Qty: 10 | Status: AC

## 2012-01-01 MED FILL — Heparin Sodium (Porcine) Inj 1000 Unit/ML: INTRAMUSCULAR | Qty: 30 | Status: AC

## 2012-01-01 MED FILL — Sodium Chloride Irrigation Soln 0.9%: Qty: 3000 | Status: AC

## 2012-01-01 NOTE — Clinical Documentation Improvement (Signed)
Anemia Blood Loss Clarification  THIS DOCUMENT IS NOT A PERMANENT PART OF THE MEDICAL RECORD  RESPOND TO THE THIS QUERY, FOLLOW THE INSTRUCTIONS BELOW:  1. If needed, update documentation for the patient's encounter via the notes activity.  2. Access this query again and click edit on the In [employer].  3. After updating, or not, click F2 to complete all highlighted (required) fields concerning your review. Select "additional documentation in the medical record" OR "no additional documentation provided".  4. Click Sign note button.  5. The deficiency will fall out of your In Basket *Please let us know if you are not able to complete this workflow by phone or e-mail (listed below).        01/01/12  Dear Dr.Takashi Korol Marton Redwood  In an effort to better capture your patient's severity of illness, reflect appropriate length of stay and utilization of resources, a review of the patient medical record has revealed the following indicators.    Based on your clinical judgment, please clarify and document in a progress note and/or discharge summary the clinical condition associated with the following supporting information:  In responding to this query please exercise your independent judgment.  The fact that a query is asked, does not imply that any particular answer is desired or expected.  Possible Clinical Conditions?   " Expected Acute Blood Loss Anemia  " Acute Blood Loss Anemia  " Precipitous drop in Hematocrit  " Other Condition  " Cannot Clinically Determine    Supporting Information:  Signs and Symptoms: EBL: per Anesthesia record  Diagnostics: H&H on 8/05:   16.8/50.2 H&H on 8/06:    7.8/23.0  IV fluids / plasma expanders: 8/06:  Cell saver, 8/06:  Albumin 5% solution x 3 doses   Reviewed: additional documentation in the medical record  Thank You,  Marciano Sequin,  Clinical Documentation Specialist:  Pager: [phone]  Health  Information Management [place]

## 2012-01-01 NOTE — Progress Notes (Signed)
Some incisional pain, but overall pleasantly surprised with how is doing  BP 107/65  Pulse 90  Temp 98.6 F (37 C) (Oral)  Resp 17  Ht 5\' 6"  (1.676 m)  Wt 166 lb 7.2 oz (75.5 kg)  BMI 26.87 kg/m2  SpO2 97%   Intake/Output Summary (Last 24 hours) at 01/01/12 1806 Last data filed at 01/01/12 1708  Gross per 24 hour  Intake 4819.3 ml  Output   9605 ml  Net -4785.7 ml    Diuresing well Hct and creatinine OK  Doing well POD # 1

## 2012-01-01 NOTE — Procedures (Signed)
Extubation Procedure Note  Patient Details:   Name: Lance Trujillo DOB: Jul 17, 1935 MRN: [id number]   Airway Documentation:   Patient extubated to 4 lpm nasal cannula.  VC 800 ml, NIF -35, patient able to lift and hold head off bed for 10 seconds.  Able to breathe around deflated cuff and vocalize post procedure.  Tolerated well, no complications noted.   Evaluation  O2 sats: stable throughout Complications: No apparent complications Patient did tolerate procedure well. Bilateral Breath Sounds: Clear   Yes  Andrina Locken, Aloha Gell 01/01/2012, 1:45 AM

## 2012-01-01 NOTE — Progress Notes (Addendum)
1 Day Post-Op Procedure(s) (LRB): CORONARY ARTERY BYPASS GRAFTING (CABG) (N/A)  Subjective: Patient without complaints this am.  Objective: Vital signs in last 24 hours: Patient Vitals for the past 24 hrs:  BP Temp Temp src Pulse Resp SpO2 Height Weight  01/01/12 0600 95/61 mmHg 99.5 F (37.5 C) Core 90  21  100 % - -  01/01/12 0500 107/70 mmHg 99.3 F (37.4 C) Core 90  17  100 % - 166 lb 7.2 oz (75.5 kg)  01/01/12 0400 103/66 mmHg 99.3 F (37.4 C) Core 90  18  100 % - -  01/01/12 0330 - 99.3 F (37.4 C) Core 90  18  100 % - -  01/01/12 0300 102/68 mmHg 99.3 F (37.4 C) Core 91  16  100 % - -  01/01/12 0230 - 99.3 F (37.4 C) Core 90  17  100 % - -  01/01/12 0200 105/69 mmHg 99 F (37.2 C) Core 90  17  100 % - -  01/01/12 0135 - 98.6 F (37 C) - 90  23  100 % - -  01/01/12 0130 - 98.4 F (36.9 C) Core 90  24  100 % - -  01/01/12 0100 93/70 mmHg 97.9 F (36.6 C) Core 90  14  100 % - -  01/01/12 0050 - 97.5 F (36.4 C) - 90  18  100 % - -  01/01/12 0030 - - Core 90  20  100 % - -  01/01/12 0020 - 96.8 F (36 C) - 90  22  100 % - -  01/01/12 0000 113/82 mmHg - Core 91  21  100 % - -  12/31/11 2353 - 96.1 F (35.6 C) - 90  16  100 % - -  12/31/11 2330 - 95.2 F (35.1 C) Core 90  12  100 % - -  12/31/11 2300 122/83 mmHg 94.3 F (34.6 C) Core 90  12  100 % - -  12/31/11 2230 - 93.7 F (34.3 C) Core 90  12  100 % - -  12/31/11 2200 - 93.6 F (34.2 C) Core 90  15  100 % - -  12/31/11 1219 - - - 55  - - - -  12/31/11 1106 146/82 mmHg 96.7 F (35.9 C) Oral 63  18  97 % 5\' 6"  (1.676 m) 159 lb (72.122 kg)   Pre op weight  72.1 kg Current Weight  01/01/12 166 lb 7.2 oz (75.5 kg)    Hemodynamic parameters for last 24 hours: PAP: (21-29)/(11-19) 23/12 mmHg CO:  [2.1 L/min-4.5 L/min] 4.1 L/min CI:  [1.8 L/min/m2-2.5 L/min/m2] 2.3 L/min/m2  Intake/Output from previous day: 08/06 0701 - 08/07 0700 In: 5875 [I.V.:4160; Blood:985; NG/GT:30; IV Piggyback:700] Out: 8000  [Urine:5975; Blood:1625; Chest Tube:400]   Physical Exam:  Cardiovascular: RRR;rub with chest tubes in place. Pulmonary: Slightly diminished at bases; no rales, wheezes, or rhonchi. Abdomen: Soft, non tender, bowel sounds present. Extremities: Mild bilateral lower extremity edema. Wounds: Dressings clean and dry.   Lab Results: CBC: Basename 01/01/12 0400 12/31/11 2200  WBC 10.4 8.2  HGB 13.6 12.6*  HCT 38.6* 35.9*  PLT 136* 92*   BMET:  Basename 01/01/12 0400 12/31/11 2159 12/30/11 1302  NA 135 137 --  K 3.7 3.3* --  CL 104 -- 100  CO2 23 -- 29  GLUCOSE 167* 121* --  BUN 7 -- 9  CREATININE 0.76 -- 1.0  CALCIUM 7.3* -- 9.6    PT/INR:  Lab Results  Component Value Date   INR 1.80* 12/31/2011   INR 1.0 12/30/2011   ABG:  INR: Will add last result for INR, ABG once components are confirmed Will add last 4 CBG results once components are confirmed  Assessment/Plan:  1. CV - A paced at 90. EKG showed SB in the 50's.On Neosynephrine at 35. Will wean as tolerates.Lopressor 12.5 bid ordered but will hold for now. 2.  Pulmonary - Encourage incentive spirometer.CXR this  am shows patient is rotated to the right, no pneumothorax.Chest tube output 400 cc since surgery. 3. Volume Overload - Begin gentle diuresis. 4. Supplement potassium. 5.Remove Swan, a line, chest tubes 6.Will check HGA1C  ZIMMERMAN,DONIELLE MPA-C 01/01/2012   Expected Acute  Blood - loss Anemia  stable post op Underlying sinus bradycardia I have seen and examined Lance Trujillo and agree with the above assessment  and plan.  Delight Ovens MD Beeper [phone] Office [phone] 01/01/2012 9:26 AM

## 2012-01-02 ENCOUNTER — Inpatient Hospital Stay (HOSPITAL_COMMUNITY): Payer: Medicare Other

## 2012-01-02 LAB — BASIC METABOLIC PANEL
BUN: 13 mg/dL (ref 6–23)
Chloride: 99 mEq/L (ref 96–112)
Creatinine, Ser: 0.83 mg/dL (ref 0.50–1.35)
GFR calc non Af Amer: 85 mL/min — ABNORMAL LOW (ref >90)
Glucose, Bld: 128 mg/dL — ABNORMAL HIGH (ref 70–99)
Potassium: 3.9 mEq/L (ref 3.5–5.1)

## 2012-01-02 LAB — CBC
HCT: 36.9 % — ABNORMAL LOW (ref 39.0–52.0)
Hemoglobin: 12.8 g/dL — ABNORMAL LOW (ref 13.0–17.0)
MCH: 28.5 pg (ref 26.0–34.0)
MCHC: 34.7 g/dL (ref 30.0–36.0)

## 2012-01-02 LAB — GLUCOSE, CAPILLARY
Glucose-Capillary: 120 mg/dL — ABNORMAL HIGH (ref 70–99)
Glucose-Capillary: 118 mg/dL — ABNORMAL HIGH (ref 70–99)

## 2012-01-02 MED ORDER — SODIUM CHLORIDE 0.9 % IJ SOLN
3.0000 mL | Freq: Two times a day (BID) | INTRAMUSCULAR | Status: DC
  Administered 2012-01-02 – 2012-01-03 (×3): 3 mL via INTRAVENOUS
  Administered 2012-01-04: 22:00:00 via INTRAVENOUS
  Administered 2012-01-05 – 2012-01-08 (×8): 3 mL via INTRAVENOUS

## 2012-01-02 MED ORDER — POTASSIUM CHLORIDE CRYS ER 20 MEQ PO TBCR: 20.0000 meq | EXTENDED_RELEASE_TABLET | Freq: Every day | ORAL | Status: DC

## 2012-01-02 MED ORDER — PANTOPRAZOLE SODIUM 40 MG PO TBEC
40.0000 mg | DELAYED_RELEASE_TABLET | Freq: Every day | ORAL | Status: DC
  Administered 2012-01-02 – 2012-01-09 (×8): 40 mg via ORAL
  Filled 2012-01-02 (×8): qty 1

## 2012-01-02 MED ORDER — BISACODYL 5 MG PO TBEC
10.0000 mg | DELAYED_RELEASE_TABLET | Freq: Every day | ORAL | Status: DC | PRN
  Administered 2012-01-02: 10 mg via ORAL
  Filled 2012-01-02: qty 2

## 2012-01-02 MED ORDER — POTASSIUM CHLORIDE CRYS ER 10 MEQ PO TBCR
10.0000 meq | EXTENDED_RELEASE_TABLET | Freq: Once | ORAL | Status: DC
  Filled 2012-01-02: qty 1

## 2012-01-02 MED ORDER — DIPHENHYDRAMINE HCL 25 MG PO CAPS: 25.0000 mg | ORAL_CAPSULE | Freq: Every evening | ORAL | Status: DC | PRN

## 2012-01-02 MED ORDER — METOPROLOL TARTRATE 12.5 MG HALF TABLET
12.5000 mg | ORAL_TABLET | Freq: Two times a day (BID) | ORAL | Status: DC
  Administered 2012-01-02 – 2012-01-05 (×6): 12.5 mg via ORAL
  Filled 2012-01-02 (×8): qty 1

## 2012-01-02 MED ORDER — ASPIRIN EC 325 MG PO TBEC
325.0000 mg | DELAYED_RELEASE_TABLET | Freq: Every day | ORAL | Status: DC
  Administered 2012-01-02 – 2012-01-09 (×8): 325 mg via ORAL
  Filled 2012-01-02 (×8): qty 1

## 2012-01-02 MED ORDER — SODIUM CHLORIDE 0.9 % IJ SOLN: 3.0000 mL | INTRAMUSCULAR | Status: DC | PRN

## 2012-01-02 MED ORDER — SODIUM CHLORIDE 0.9 % IV SOLN: 250.0000 mL | INTRAVENOUS | Status: DC | PRN

## 2012-01-02 MED ORDER — ATORVASTATIN CALCIUM 10 MG PO TABS
10.0000 mg | ORAL_TABLET | Freq: Every day | ORAL | Status: DC
  Administered 2012-01-02 – 2012-01-08 (×7): 10 mg via ORAL
  Filled 2012-01-02 (×8): qty 1

## 2012-01-02 MED ORDER — OXYCODONE HCL 5 MG PO TABS
5.0000 mg | ORAL_TABLET | ORAL | Status: DC | PRN
  Administered 2012-01-02 – 2012-01-03 (×5): 5 mg via ORAL
  Administered 2012-01-04: 10 mg via ORAL
  Administered 2012-01-04 – 2012-01-09 (×21): 5 mg via ORAL
  Filled 2012-01-02 (×7): qty 1
  Filled 2012-01-02: qty 2
  Filled 2012-01-02 (×5): qty 1
  Filled 2012-01-02: qty 2
  Filled 2012-01-02 (×5): qty 1
  Filled 2012-01-02: qty 2
  Filled 2012-01-02 (×7): qty 1

## 2012-01-02 MED ORDER — BISACODYL 10 MG RE SUPP: 10.0000 mg | Freq: Every day | RECTAL | Status: DC | PRN

## 2012-01-02 MED ORDER — FUROSEMIDE 40 MG PO TABS: 40.0000 mg | ORAL_TABLET | Freq: Every day | ORAL | Status: DC

## 2012-01-02 MED ORDER — POTASSIUM CHLORIDE CRYS ER 20 MEQ PO TBCR
20.0000 meq | EXTENDED_RELEASE_TABLET | Freq: Every day | ORAL | Status: AC
  Administered 2012-01-02 – 2012-01-04 (×3): 20 meq via ORAL
  Filled 2012-01-02 (×3): qty 1

## 2012-01-02 MED ORDER — MOVING RIGHT ALONG BOOK
Freq: Once | Status: AC
  Administered 2012-01-02: 09:00:00
  Filled 2012-01-02: qty 1

## 2012-01-02 MED ORDER — DOCUSATE SODIUM 100 MG PO CAPS
200.0000 mg | ORAL_CAPSULE | Freq: Every day | ORAL | Status: DC
  Administered 2012-01-02 – 2012-01-09 (×8): 200 mg via ORAL
  Filled 2012-01-02 (×8): qty 2

## 2012-01-02 MED ORDER — FUROSEMIDE 20 MG PO TABS
20.0000 mg | ORAL_TABLET | Freq: Every day | ORAL | Status: AC
  Administered 2012-01-02 – 2012-01-04 (×3): 20 mg via ORAL
  Filled 2012-01-02 (×3): qty 1

## 2012-01-02 MED ORDER — ONDANSETRON HCL 4 MG PO TABS
4.0000 mg | ORAL_TABLET | Freq: Four times a day (QID) | ORAL | Status: DC | PRN
  Administered 2012-01-02: 4 mg via ORAL
  Filled 2012-01-02: qty 1

## 2012-01-02 MED ORDER — ONDANSETRON HCL 4 MG/2ML IJ SOLN: 4.0000 mg | Freq: Four times a day (QID) | INTRAMUSCULAR | Status: DC | PRN

## 2012-01-02 MED ORDER — TRAMADOL HCL 50 MG PO TABS
50.0000 mg | ORAL_TABLET | ORAL | Status: DC | PRN
  Administered 2012-01-02: 100 mg via ORAL
  Filled 2012-01-02: qty 2

## 2012-01-02 MED ORDER — INSULIN ASPART 100 UNIT/ML ~~LOC~~ SOLN: 0.0000 [IU] | Freq: Three times a day (TID) | SUBCUTANEOUS | Status: DC

## 2012-01-02 MED ORDER — ACETAMINOPHEN 325 MG PO TABS
650.0000 mg | ORAL_TABLET | Freq: Four times a day (QID) | ORAL | Status: DC | PRN
Start: 2012-01-02 — End: 2012-01-09

## 2012-01-02 MED FILL — Magnesium Sulfate Inj 50%: INTRAMUSCULAR | Qty: 10 | Status: AC

## 2012-01-02 MED FILL — Potassium Chloride Inj 2 mEq/ML: INTRAVENOUS | Qty: 40 | Status: AC

## 2012-01-02 MED FILL — Tranexamic Acid Inj 100 MG/ML: INTRAVENOUS | Qty: 2500 | Status: AC

## 2012-01-02 NOTE — Care Management Note (Signed)
    Page 1 of 1   01/08/2012     3:40:44 PM   CARE MANAGEMENT NOTE 01/08/2012  Patient:  Lance Trujillo, Lance Trujillo   Account Number:  [id number]  Date Initiated:  01/02/2012  Documentation initiated by:  Miosotis Wetsel  Subjective/Objective Assessment:   PT S/P EMERGENT CABG X 5 ON 12/31/11.  PTA, PT INDEPENDENT, LIVES WITH SPOUSE.     Action/Plan:   MET WITH PT AND WIFE TO DISCUSS DC PLANS.  PT LIVES IN [place]--THEY PLAN TO STAY WITH SON IN [place] UNTIL CLEARED BY SURGEON.  THEY PLAN TO STAY IN A HOTEL, AS SON HAS LARGE DOGS THAT JUMP UP.  FOLLOW FOR HOME NEEDS AS PT PROGRESSES.   Anticipated DC Date:  01/05/2012   Anticipated DC Plan:  HOME W HOME HEALTH SERVICES      DC Planning Services  CM consult      Choice offered to / List presented to:             Status of service:  Completed, signed off Medicare Important Message given?   (If response is "NO", the following Medicare IM given date fields will be blank) Date Medicare IM given:   Date Additional Medicare IM given:    Discharge Disposition:  HOME/SELF CARE  Per UR Regulation:  Reviewed for med. necessity/level of care/duration of stay  If discussed at Long Length of Stay Meetings, dates discussed:    Comments:  01/08/12 Natika Geyer,RN,BSN [phone] PT HOSPITAL STAY COMPLICATED BY POST OP AFIB.  PLANNING POSSIBLE DC IN AM IF REMAINS IN NSR.

## 2012-01-02 NOTE — Progress Notes (Signed)
Pt had not voided since foley removed, no urge or pain. Bladder scan done, 303 cc in bladder. Pt wanted to walk before trying in and out catheter. Pt walked 400 ft with walker and standby assist with no complaints. Pt urinated 175 cc after walk. Will continue to monitor and reassess urine output.

## 2012-01-02 NOTE — Progress Notes (Addendum)
2 Days Post-Op Procedure(s) (LRB): CORONARY ARTERY BYPASS GRAFTING (CABG) (N/A)  Subjective: Patient's only complaint is not sleeping.  Objective: Vital signs in last 24 hours: Patient Vitals for the past 24 hrs:  BP Temp Temp src Pulse Resp SpO2 Weight  01/02/12 0700 106/61 mmHg - - 72  19  95 % -  01/02/12 0600 89/54 mmHg - - 65  16  95 % 165 lb 5.5 oz (75 kg)  01/02/12 0500 93/55 mmHg 98.1 F (36.7 C) Oral 66  16  94 % -  01/02/12 0400 95/59 mmHg - - 67  19  96 % -  01/02/12 0300 98/61 mmHg - - 66  16  96 % -  01/02/12 0200 100/56 mmHg - - 67  16  96 % -  01/02/12 0100 96/54 mmHg - - 70  16  96 % -  01/02/12 0000 92/54 mmHg 97.7 F (36.5 C) Oral 71  18  95 % -  01/01/12 2300 100/61 mmHg - - 74  20  98 % -  01/01/12 2200 108/61 mmHg - - 75  20  98 % -  01/01/12 2100 97/62 mmHg - - 89  17  97 % -  01/01/12 2000 101/61 mmHg - - 90  18  98 % -  01/01/12 1948 - 98.7 F (37.1 C) Oral - - - -  01/01/12 1900 98/63 mmHg - - 90  24  97 % -  01/01/12 1845 - - - 90  21  98 % -  01/01/12 1830 - - - 90  23  99 % -  01/01/12 1815 - - - 90  23  98 % -  01/01/12 1800 104/58 mmHg - - 90  22  97 % -  01/01/12 1745 - - - 90  20  97 % -  01/01/12 1730 - - - 90  17  97 % -  01/01/12 1715 - - - 90  19  97 % -  01/01/12 1700 107/65 mmHg - - 91  21  98 % -  01/01/12 1645 - - - 90  21  98 % -  01/01/12 1630 - - - 90  21  99 % -  01/01/12 1615 - - - 90  19  98 % -  01/01/12 1603 - 98.6 F (37 C) Oral - - - -  01/01/12 1600 126/75 mmHg - - 90  20  98 % -  01/01/12 1545 - - - 90  18  98 % -  01/01/12 1530 - - - 90  16  97 % -  01/01/12 1500 96/66 mmHg - - 90  18  98 % -  01/01/12 1400 101/65 mmHg - - 91  24  - -  01/01/12 1300 86/58 mmHg - - 89  23  - -  01/01/12 1200 98/68 mmHg - - 90  20  100 % -  01/01/12 1150 - 97.9 F (36.6 C) Oral - - - -  01/01/12 1100 88/62 mmHg - - 90  20  99 % -  01/01/12 1000 100/66 mmHg 99.3 F (37.4 C) - 90  20  100 % -  01/01/12 0900 94/65 mmHg 99.5 F (37.5  C) - 90  22  100 % -  01/01/12 0817 - 99.7 F (37.6 C) Core - - - -  01/01/12 0800 94/61 mmHg 99.7 F (37.6 C) - 90  20  100 % -   Pre op weight  72.1  kg Current Weight  01/02/12 165 lb 5.5 oz (75 kg)    Hemodynamic parameters for last 24 hours: PAP: (21-25)/(9-13) 21/9 mmHg  Intake/Output from previous day: 08/07 0701 - 08/08 0700 In: 1083.7 [P.O.:60; I.V.:773.7; IV Piggyback:250] Out: 2310 [Urine:2150; Chest Tube:160]   Physical Exam:  Cardiovascular: RRR;rub with chest tubes in place. Pulmonary: Slightly diminished at bases; no rales, wheezes, or rhonchi. Abdomen: Soft, non tender, bowel sounds present. Extremities: Mild bilateral lower extremity edema. Wounds: Dressings clean and dry.   Lab Results: CBC:  Basename 01/02/12 0345 01/01/12 1700  WBC 11.7* 11.6*  HGB 12.8* 13.3  HCT 36.9* 38.4*  PLT 112* 131*   BMET:   Basename 01/02/12 0345 01/01/12 1700 01/01/12 1659 01/01/12 0400  NA 131* -- 135 --  K 3.9 -- 3.4* --  CL 99 -- 98 --  CO2 23 -- -- 23  GLUCOSE 128* -- 128* --  BUN 13 -- 8 --  CREATININE 0.83 0.78 -- --  CALCIUM 7.8* -- -- 7.3*    PT/INR:  Lab Results  Component Value Date   INR 1.80* 12/31/2011   INR 1.0 12/30/2011   ABG:  INR: Will add last result for INR, ABG once components are confirmed Will add last 4 CBG results once components are confirmed  Assessment/Plan:  1. CV - SR. On Lopressor 12.5 bid with parameters. 2.  Pulmonary - Encourage incentive spirometer.CXR this  am shows no pneumothorax, bibasilar atelectasis, some vascular congestion.Chest tube with 160 cc of output last 24 hours. Likely remove 3. Volume Overload - Continue with gentle diuresis. 4.Mild ABL anemia-H and H this am 12.8 and 36.9. 5.Supplement potassium 6.Await HGA1C results. 7.Likely transfer to PCTU.  ZIMMERMAN,DONIELLE MPA-C 01/02/2012    Stable sinus, preop underlying rhythm slow so will have to watch beta blocker carefully I have seen and examined  Caleen Essex and agree with the above assessment  and plan.  Delight Ovens MD Beeper [phone] Office [phone] 01/02/2012 8:42 AM

## 2012-01-02 NOTE — Op Note (Signed)
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  SURGICAL PROCEDURE:  Coronary artery bypass grafting x5 left internal mammary to the left anterior descending coronary artery, reverse saphenous vein graft to the distal first obtuse marginal, triple sequential reverse saphenous vein, vein graft to the acute marginal posterior descending, and distal circumflex with endo vein harvesting from the left thigh and calf.  SURGEON:  Sheliah Plane, MD.  FIRST ASSISTANT:  Rowe Clack, P.A.-C  BRIEF HISTORY:  The patient is a 76 year old male with a 55-month history of progressive anginal symptoms, was seen in the [facility] the day prior for 1st visit and because of his symptoms he was arranged for cardiac catheterization on the day of surgery.  Cardiac catheterization by Dr. [place] revealed greater than 95% left main obstruction with 80% to 90% mid LAD obstruction and 95% ostial right obstruction.  Overall ventricular function was preserved.  Because of the patient's progressive symptoms, and very critical anatomy, coronary artery bypass grafting was recommended.  The patient was seen in the holding area, and coronary artery bypass grafting was reviewed with him and his family and he was agreeable with proceeding.  The risks and options were discussed in detail.  DESCRIPTION OF PROCEDURE:  With Swan-Ganz and arterial line monitors in place, the patient underwent general endotracheal anesthesia without incidence.  Skin of the chest and legs were  prepped with Betadine and draped in usual sterile manner.  A transesophageal echo probe was placed.  Appropriate time-out was performed.  Small incision was made at the right knee, vein at the right knee appears slightly small, so a small incision was made at the left knee.  The vein in the left appeared larger.  Using the [employer] endo vein harvesting system, a segment of vein was harvested from the thigh and calf and was of good quality and caliber.  Median sternotomy was performed.  Left internal mammary artery was dissected down as a pedicle graft.  The artery was somewhat adherent to the chest wall, but came down and after dissection had excellent free flow.  The vessel was hydrostatically dilated with heparinized saline/solution.  Pericardium was opened.  Overall ventricular function appeared preserved.  The patient was systemically heparinized. Ascending aorta was cannulated.  The right atrium was cannulated and aortic root vent cardioplegia needle was introduced into the ascending aorta.  The patient was placed on cardiopulmonary bypass 2.4 L/minute per meter squared.  Sites of anastomosis were selected and dissected out of the epicardium.  The patient's body temperature was cooled to 32 degrees.  Aortic crossclamp was applied and 500 mL of cold blood potassium cardioplegia was administered with diastolic arrest of the heart.  Myocardial septal temperature was monitored throughout  the crossclamp.  Attention was turned 1st to the large 1st obtuse marginal, which was extensive branch, but with severe disease throughout it.  In the distal 3rd of the vessel was area soft enough to open and bypass. The vessel was opened and admitted a 1 mm probe distally.  Using a running 7-0 Prolene, distal anastomosis was performed.  Additional cold blood cardioplegia was administered intermittently down the vein graft. Attention was then turned to the right system.  The right coronary artery was a  nondominant branch supplying the acute marginal and the posterior descending arose from the circumflex system.  The heart was elevated, the posterior descending coronary artery was opened and admitted a 1 mm probe distally.  It was a very small vessel.  Using a diamond type side-to-side anastomosis was carried out with a running 8-0 Prolene.  Just to extend the same vein was then carried onto the larger distal circumflex branch, which was approximately 1.3 to 1.4 mm in size. Using a running 8-0 Prolene, distal anastomosis was performed.  The acute marginal vessel was then opened and was approximately 1.2 to 1.3 mm in size, and a longitudinal side-to-side anastomosis was carried out with the same vein that was anastomosed to the posterior descending and distal circumflex.  Additional cold blood cardioplegia was administered down the vein grafts.  The mid left anterior descending coronary artery was then opened using a running 8-0 Prolene left internal mammary artery was anastomosed to the left anterior descending coronary artery.  With release of the bulldog, there was rise in myocardial septal temperature. The bulldog was placed back on the mammary artery with crossclamp still in place, 2 punch aortotomies were performed and each of the 2 vein grafts were anastomosed to the ascending aorta.  Air was evacuated from the ascending aorta and grafts and aortic cross-clamp was removed with total cross-clamp time of 101 minutes.  The patient spontaneously converted to a sinus rhythm.  He was atrially paced, increased his rate up to 90.  Site of anastomosis were inspected and free of bleeding.  He was then ventilated and weaned from cardiopulmonary bypass without difficulty.  Total pump time was 129 minutes.  He was decannulated in usual fashion.  Protamine sulfate was administered with operative field hemostatic.  A left pleural tube and Blake mediastinal drain were left in place.  Pericardium  was reapproximated.  Sternum was closed with #6 stainless steel wire.  Fascia closed with interrupted 0-Vicryl, running 3-0 Vicryl in subcutaneous tissue, 4-0 subcuticular stitch in skin edges.  Dry dressings were applied.  Sponge and needle count was reported as correct at the completion of procedure.  The patient tolerated the procedure without obvious complication.  He was transferred to Surgical Intensive Care Unit for further postoperative care.     Sheliah Plane, MD     EG/MEDQ  D:  01/01/2012  T:  01/02/2012  Job:  [id number]  cc:   [name]. Sw[place]il[place], M.D.

## 2012-01-02 NOTE — Progress Notes (Signed)
Transferred to 2018 via wheelchair. Portable monitor on.  No changes.

## 2012-01-03 ENCOUNTER — Inpatient Hospital Stay (HOSPITAL_COMMUNITY): Payer: Medicare Other

## 2012-01-03 LAB — BASIC METABOLIC PANEL
BUN: 13 mg/dL (ref 6–23)
CO2: 22 mEq/L (ref 19–32)
Calcium: 8.1 mg/dL — ABNORMAL LOW (ref 8.4–10.5)
Chloride: 94 mEq/L — ABNORMAL LOW (ref 96–112)
Creatinine, Ser: 0.88 mg/dL (ref 0.50–1.35)
GFR calc Af Amer: >90 mL/min (ref >90)
GFR calc non Af Amer: 83 mL/min — ABNORMAL LOW (ref >90)
Glucose, Bld: 110 mg/dL — ABNORMAL HIGH (ref 70–99)
Potassium: 3.8 mEq/L (ref 3.5–5.1)
Sodium: 128 mEq/L — ABNORMAL LOW (ref 135–145)

## 2012-01-03 LAB — CBC
HCT: 35.9 % — ABNORMAL LOW (ref 39.0–52.0)
Hemoglobin: 12.3 g/dL — ABNORMAL LOW (ref 13.0–17.0)
MCH: 28.8 pg (ref 26.0–34.0)
MCHC: 34.3 g/dL (ref 30.0–36.0)
MCV: 84.1 fL (ref 78.0–100.0)
Platelets: 128 10*3/uL — ABNORMAL LOW (ref 150–400)
RBC: 4.27 MIL/uL (ref 4.22–5.81)
RDW: 14 % (ref 11.5–15.5)
WBC: 9.7 10*3/uL (ref 4.0–10.5)

## 2012-01-03 LAB — GLUCOSE, CAPILLARY
Glucose-Capillary: 117 mg/dL — ABNORMAL HIGH (ref 70–99)
Glucose-Capillary: 104 mg/dL — ABNORMAL HIGH (ref 70–99)
Glucose-Capillary: 96 mg/dL (ref 70–99)

## 2012-01-03 MED ORDER — AMIODARONE IV BOLUS ONLY 150 MG/100ML
150.0000 mg | Freq: Once | INTRAVENOUS | Status: AC
  Administered 2012-01-03: 150 mg via INTRAVENOUS
  Filled 2012-01-03: qty 100

## 2012-01-03 MED ORDER — OXYCODONE HCL 5 MG PO TABS: 5.0000 mg | ORAL_TABLET | ORAL | Status: AC | PRN

## 2012-01-03 MED ORDER — METOPROLOL TARTRATE 25 MG PO TABS: 12.5000 mg | ORAL_TABLET | Freq: Two times a day (BID) | ORAL | Status: DC

## 2012-01-03 MED ORDER — AMIODARONE HCL 200 MG PO TABS: 400.0000 mg | ORAL_TABLET | Freq: Every day | ORAL | Status: DC

## 2012-01-03 MED ORDER — ENOXAPARIN SODIUM 40 MG/0.4ML ~~LOC~~ SOLN
40.0000 mg | SUBCUTANEOUS | Status: DC
  Administered 2012-01-03 – 2012-01-06 (×4): 40 mg via SUBCUTANEOUS
  Filled 2012-01-03 (×5): qty 0.4

## 2012-01-03 MED ORDER — AMIODARONE HCL IN DEXTROSE 360-4.14 MG/200ML-% IV SOLN
60.0000 mg/h | INTRAVENOUS | Status: AC
  Administered 2012-01-03: 60 mg/h via INTRAVENOUS
  Administered 2012-01-03: 30 mg/h via INTRAVENOUS
  Administered 2012-01-03: 60 mg/h via INTRAVENOUS
  Filled 2012-01-03 (×8): qty 200

## 2012-01-03 NOTE — Progress Notes (Addendum)
3 Days Post-Op Procedure(s) (LRB): CORONARY ARTERY BYPASS GRAFTING (CABG) (N/A)  Subjective: Lance Trujillo complains of some chest soreness.  He did have a brief episode of A. Fib this morning with immediate conversion to NSR.  Objective: Vital signs in last 24 hours: Temp:  [97.8 F (36.6 C)-98.5 F (36.9 C)] 97.8 F (36.6 C) (08/09 0641) Pulse Rate:  [60-73] 73  (08/09 0641) Cardiac Rhythm:  [-] Normal sinus rhythm (08/08 1945) Resp:  [13-19] 19  (08/09 0641) BP: (90-113)/(58-71) 109/69 mmHg (08/09 0641) SpO2:  [93 %-96 %] 94 % (08/09 0641)  Intake/Output from previous day: 08/08 0701 - 08/09 0700 In: 700 [P.O.:660; I.V.:40] Out: 735 [Urine:735]  General appearance: alert, cooperative and no distress Heart: regular rate and rhythm Lungs: clear to auscultation bilaterally Abdomen: soft, non-tender; bowel sounds normal; no masses,  no organomegaly Extremities: edema trace Wound: clean and dry  Lab Results:  [facility] 01/03/12 0707 01/02/12 0345  WBC 9.7 11.7*  HGB 12.3* 12.8*  HCT 35.9* 36.9*  PLT 128* 112*   BMET:  Basename 01/02/12 0345 01/01/12 1700 01/01/12 1659 01/01/12 0400  NA 131* -- 135 --  K 3.9 -- 3.4* --  CL 99 -- 98 --  CO2 23 -- -- 23  GLUCOSE 128* -- 128* --  BUN 13 -- 8 --  CREATININE 0.83 0.78 -- --  CALCIUM 7.8* -- -- 7.3*    PT/INR:  Basename 12/31/11 2200  LABPROT 21.2*  INR 1.80*   ABG    Component Value Date/Time   PHART 7.378 01/01/2012 0121   HCO3 22.7 01/01/2012 0121   TCO2 22 01/01/2012 1659   ACIDBASEDEF 2.0 01/01/2012 0121   O2SAT 99.0 01/01/2012 0121   CBG (last 3)   Basename 01/03/12 0626 01/02/12 2154 01/02/12 1629  GLUCAP 104* 117* 118*    Assessment/Plan: S/P Procedure(s) (LRB): CORONARY ARTERY BYPASS GRAFTING (CABG) (N/A)  1. CV- brief episode of A.Fib this morning, immediate conversion to NSR, will keep on Lopressor 12.5mg  BID and follow, patient bradycardic preop 2. Pulm- bilateral atelectasis, pleural effusions will  continue IS 3. Discontinue Fingersticks and SSIP, patient is not a diabetic 4. Dispo- patient progressing well, will monitor heart rate today if  Maintains NSR will plan to D/C EPW in AM and likely discharge on Sunday   LOS: 3 days    BARRETT, ERIN 01/03/2012  This afternoon developed rapid AF, has been started on Cordarone drip.  I have seen and examined Caleen Essex and agree with the above assessment  and plan.  Delight Ovens MD Beeper [phone] Office [phone] 01/03/2012 5:38 PM

## 2012-01-03 NOTE — Progress Notes (Signed)
Pt HR 57-60, B/P 99/64, pt asymptomatic. MD notified, no orders given.

## 2012-01-03 NOTE — Progress Notes (Signed)
Pt ambulated 300 ft with walker and standby assist, no complaints, will continue to monitor.

## 2012-01-03 NOTE — Discharge Summary (Signed)
[address]            [place] [address]          [phone]         Discharge Summary  Name: Lance Trujillo DOB: February 18, 1936 76 y.o. MRN: [id number]   Admission Date: 12/31/2011 Discharge Date:     Admitting Diagnosis: Active Problems:  Chest pain  Left Main Coronary Artery Disease    Discharge Diagnosis:  High-grade left main and severe three-vessel coronary artery disease Expected postoperative blood loss anemia Transient postoperative atrial fibrillation  Past Medical History  Diagnosis Date  . Prostate cancer     s/p robotic prostatectomy per Dr. Laverle Patter  . HTN (hypertension)   . HLD (hyperlipidemia)   . Bradycardia      Procedures: EMERGENCY CORONARY ARTERY BYPASS GRAFTING x 5 (left internal mammary artery to the left anterior descending, saphenous vein graft to distal first obtuse marginal, sequential saphenous vein graft to the acute marginal, posterior descending, and distal circumflex), endoscopic vein harvest left leg on 12/31/2011   HPI:  The patient is a 76 y.o. male with no prior history of coronary artery disease. He had previously seen Dr. Swaziland 3-4 years ago for stress testing prior to prostate cancer surgery. He was seen in the [facility] Cardiology office on 12/30/2011 with a one-year history of progressive dyspnea on exertion. Over the past several months,  he has also developed exertional chest tightness which is relieved with rest. It was recommended that he proceed with cardiac catheterization. This was performed on 12/31/2011 by Dr. Peter Swaziland and revealed high-grade left main coronary artery stenosis as well as severe three-vessel disease. He was subsequently admitted to [facility] for further evaluation and treatment.    Hospital Course:  The patient was admitted to [facility] on 12/31/2011. He was started on heparin. A cardiac surgery consult was requested. The patient was seen by Dr. Sheliah Plane  and his films were reviewed. In light of his high grade left main stenosis and worsening symptoms of ischemia, it was recommended that he proceed with urgent surgical revascularization. All risks, benefits and alternatives of surgery were explained in detail, and the patient agreed to proceed. The patient was taken to the operating room and underwent the above procedure.    The postoperative course has generally been uneventful. He initially was atrially paced in light of his history of sinus bradycardia. Once his heart rate improved, the pacer was discontinued and he was started on a low-dose beta blocker. He did have a transient episode of atrial fibrillation on postop day 3 which converted spontaneously to normal sinus rhythm. He has otherwise remained stable. He is ambulating in the halls without difficulty and tolerating a regular diet. He has had an expected postoperative blood loss anemia which has not required transfusion. He has been started on Lasix for volume overload and is diuresing well. We anticipate discharge home within the next 48 hours provided no acute changes occur.     Recent vital signs:  Filed Vitals:   01/03/12 1251  BP: 114/72  Pulse:   Temp:   Resp:     Recent laboratory studies:  CBC: Basename 01/03/12 0707 01/02/12 0345  WBC 9.7 11.7*  HGB 12.3* 12.8*  HCT 35.9* 36.9*  PLT 128* 112*   BMET:  Basename 01/03/12 0707 01/02/12 0345  NA 128* 131*  K  3.8 3.9  CL 94* 99  CO2 22 23  GLUCOSE 110* 128*  BUN 13 13  CREATININE 0.88 0.83  CALCIUM 8.1* 7.8*    PT/INR:  Basename 12/31/11 2200  LABPROT 21.2*  INR 1.80*     Discharge Medications:   Medication List  As of 01/03/2012  3:31 PM   STOP taking these medications         hydrochlorothiazide 50 MG tablet      nitroGLYCERIN 0.4 MG SL tablet      TOPROL XL 25 MG 24 hr tablet         TAKE these medications         amiodarone 200 MG tablet   Commonly known as: PACERONE   Take 2 tablets (400 mg  total) by mouth daily. Take Amiodarone 400 mg po two times daily for one week;then take Amiodarone 400 mg po daily thereafter      aspirin 81 MG EC tablet   Take 1 tablet (81 mg total) by mouth daily. Swallow whole.      atorvastatin 10 MG tablet   Commonly known as: LIPITOR   Take 10 mg by mouth daily.      metoprolol tartrate 25 MG tablet   Commonly known as: LOPRESSOR   Take 0.5 tablets (12.5 mg total) by mouth 2 (two) times daily.      OVER THE COUNTER MEDICATION   Take 1 tablet by mouth daily. Takes Airborne daily      oxyCODONE 5 MG immediate release tablet   Commonly known as: Oxy IR/ROXICODONE   Take 1 tablet (5 mg total) by mouth every 4 (four) hours as needed for pain.             Discharge Instructions:  The patient is to refrain from driving, heavy lifting or strenuous activity.  May shower daily and clean incisions with soap and water.  May resume regular diet.   Follow Up:  Discharge Orders    Future Appointments: Provider: Department: Dept Phone: Center:   01/30/2012 10:45 AM Delight Ovens, MD [facility] [phone] [facility]      Follow-up Information    Follow up with Peter Swaziland, MD. (Call for a follow up appointment for 2 weeks)    Contact information:   [address] [place] [address] [phone]       Follow up with GERHARDT,EDWARD B, MD. (PA/LAT CXR to be taken (at [facility] which is in the same building as Dr. Dennie Maizes office) on 01/30/2012  at 9:30 am;Appointment with Dr. Tyrone Sage is on 01/30/2012 at 10:30 am)    Contact information:   [address] [place] [address] [phone]       Follow up with Medical doctor. (Call for a follow up regarding further surveillance of HGA1C 5.7)           Julaine Zimny H 01/03/2012, 3:31 PM

## 2012-01-03 NOTE — Progress Notes (Signed)
Pt rung A-fib on the monitor and HR at 162, but did not sustain, went right back into NSR in the 70's-80's. Pt asymptomatic, VS stable, will continue to monitor.

## 2012-01-03 NOTE — Progress Notes (Signed)
Pt voided 100 cc. Bladder scan done, had 223 cc in bladder, pt states no pain in bladder. Will continue to monitor pt urine output.

## 2012-01-03 NOTE — Discharge Instructions (Signed)
Activity: 1.May walk up steps                2.No lifting more than ten pounds for four weeks.                 3.No driving for four weeks.                4.Stop any activity that causes chest pain, shortness of breath, dizziness, sweating or excessive weakness.                5.Avoid straining.                6.Continue with your breathing exercises daily.  Diet: Diabetic diet and Low fat, Low salt diet  Wound Care: May shower.  Clean wounds with mild soap and water daily. Contact the office at [phone] if any problems arise.  Coronary Artery Bypass Grafting Care After Refer to this sheet in the next few weeks. These instructions provide you with information on caring for yourself after your procedure. Your caregiver may also give you more specific instructions. Your treatment has been planned according to current medical practices, but problems sometimes occur. Call your caregiver if you have any problems or questions after your procedure.  Recovery from open heart surgery will be different for everyone. Some people feel well after 3 or 4 weeks, while for others it takes longer. After heart surgery, it may be normal to:  Not have an appetite, feel nauseated by the smell of food, or only want to eat a small amount.   Be constipated because of changes in your diet, activity, and medicines. Eat foods high in fiber. Add fresh fruits and vegetables to your diet. Stool softeners may be helpful.   Feel sad or unhappy. You may be frustrated or cranky. You may have good days and bad days. Do not give up. Talk to your caregiver if you do not feel better.   Feel weakness and fatigue. You many need physical therapy or cardiac rehabilitation to get your strength back.   Develop an irregular heartbeat called atrial fibrillation. Symptoms of atrial fibrillation are a fast, irregular heartbeat or feelings of fluttery heartbeats, shortness of breath, low blood pressure, and dizziness. If these symptoms  develop, see your caregiver right away.  MEDICATION  Have a list of all the medicines you will be taking when you leave the hospital. For every medicine, know the following:   Name.   Exact dose.   Time of day to be taken.   How often it should be taken.   Why you are taking it.   Ask which medicines should or should not be taken together. If you take more than one heart medicine, ask if it is okay to take them together. Some heart medicines should not be taken at the same time because they may lower your blood pressure too much.   Narcotic pain medicine can cause constipation. Eat fresh fruits and vegetables. Add fiber to your diet. Stool softener medicine may help relieve constipation.   Keep a copy of your medicines with you at all times.   Do not add or stop taking any medicine until you check with your caregiver.   Medicines can have side effects. Call your caregiver who prescribed the medicine if you:   Start throwing up, have diarrhea, or have stomach pain.   Feel dizzy or lightheaded when you stand up.   Feel your heart is skipping beats or is   beating too fast or too slow.   Develop a rash.   Notice unusual bruising or bleeding.  HOME CARE INSTRUCTIONS  After heart surgery, it is important to learn how to take your pulse. Have your caregiver show you how to take your pulse.   Use your incentive spirometer. Ask your caregiver how long after surgery you need to use it.  Care of your chest incision  Tell your caregiver right away if you notice clicking in your chest (sternum).   Support your chest with a pillow or your arms when you take deep breaths and cough.   Follow your caregiver's instructions about when you can bathe or swim.   Protect your incision from sunlight during the first year to keep the scar from getting dark.   Tell your caregiver if you notice:   Increased tenderness of your incision.   Increased redness or swelling around your incision.     Drainage or pus from your incision.  Care of your leg incision(s)  Avoid crossing your legs.   Avoid sitting for long periods of time. Change positions every half hour.   Elevate your leg(s) when you are sitting.   Check your leg(s) daily for swelling. Check the incisions for redness or drainage.   Wear your elastic stockings as told by your caregiver. Take them off at bedtime.  Diet  Diet is very important to heart health.   Eat plenty of fresh fruits and vegetables. Meats should be lean cut. Avoid canned, processed, and fried foods.   Talk to a dietician. They can teach you how to make healthy food and drink choices.  Weight  Weigh yourself every day. This is important because it helps to know if you are retaining fluid that may make your heart and lungs work harder.   Use the same scale each time.   Weigh yourself every morning at the same time. You should do this after you go to the bathroom, but before you eat breakfast.   Your weight will be more accurate if you do not wear any clothes.   Record your weight.   Tell your caregiver if you have gained 2 pounds or more overnight.  Activity Stop any activity at once if you have chest pain, shortness of breath, irregular heartbeats, or dizziness. Get help right away if you have any of these symptoms.  Bathing.  Avoid soaking in a bath or hot tub until your incisions are healed.   Rest. You need a balance of rest and activity.   Exercise. Exercise per your caregiver's advice. You may need physical therapy or cardiac rehabilitation to help strengthen your muscles and build your endurance.   Climbing stairs. Unless your caregiver tells you not to climb stairs, go up stairs slowly and rest if you tire. Do not pull yourself up by the handrail.   Driving a car. Follow your caregiver's advice on when you may drive. You may ride as a passenger at any time. When traveling for long periods of time in a car, get out of the car and  walk around for a few minutes every 2 hours.   Lifting. Avoid lifting, pushing, or pulling anything heavier than 10 pounds for 6 weeks after surgery or as told by your caregiver.   Returning to work. Check with your caregiver. People heal at different rates. Most people will be able to go back to work 6 to 12 weeks after surgery.   Sexual activity. You may resume sexual relations   as told by your caregiver.  SEEK MEDICAL CARE IF:  Any of your incisions are red, painful, or have any type of drainage coming from them.   You have an oral temperature above 102 F (38.9 C).   You have ankle or leg swelling.   You have pain in your legs.   You have weight gain of 2 or more pounds a day.   You feel dizzy or lightheaded when you stand up.  SEEK IMMEDIATE MEDICAL CARE IF:  You have angina or chest pain that goes to your jaw or arms. Call your local emergency services right away.   You have shortness of breath at rest or with activity.   You have a fast or irregular heartbeat (arrhythmia).   There is a "clicking" in your sternum when you move.   You have numbness or weakness in your arms or legs.  MAKE SURE YOU:  Understand these instructions.   Will watch your condition.   Will get help right away if you are not doing well or get worse.  Document Released: 11/30/2004 Document Revised: 05/02/2011 Document Reviewed: 07/18/2010 [employer] Patient Information 2012 [employer].    

## 2012-01-03 NOTE — Progress Notes (Signed)
  Amiodarone Drug - Drug Interaction Consult Note  Recommendations: - Lance Trujillo is on Lipitor- please reiterate the risk of myalgias with statins. Risk of this ADE with Amio and low dose Lipitor is low. - Please advise Lance Trujillo alerts any providers that might prescribe antibiotics or other QT prolonging meds that he is on Amiodarone, so they may screen for risk of QT prolongation. _____________________________________________  Amiodarone is metabolized by the cytochrome P450 system and therefore has the potential to cause many drug interactions. Amiodarone has an average plasma half-life of 50 days (range 20 to 100 days).   There is potential for drug interactions to occur several weeks or months after stopping treatment and the onset of drug interactions may be slow after initiating amiodarone.   [x]  Statins: Increased risk of myopathy. Simvastatin- restrict dose to 20mg  daily. Other statins: counsel patients to report any muscle pain or weakness immediately.  []  Anticoagulants: Amiodarone can increase anticoagulant effect. Consider warfarin dose reduction. Patients should be monitored closely and the dose of anticoagulant altered accordingly, remembering that amiodarone levels take several weeks to stabilize.  []  Antiepileptics: Amiodarone can increase plasma concentration of phenytoin, phenytoin dose should be reduced. Note that small changes in phenytoin dose can result in large changes in phenytoin levels. Monitor patient closely and counsel on signs of toxicity.  [x]  Beta blockers: increased risk of bradycardia, AV block and myocardial depression. Sotalol - avoid concomitant use.  []   Calcium channel blockers (diltiazem and verapamil): increased risk of bradycardia, AV block and myocardial depression.  []   Cyclosporine: Amiodarone increases levels of cyclosporine. Reduced dose of cyclosporine is recommended.  []  Digoxin dose should be halved when amiodarone is started.  []  Diuretics:  increased risk of cardiotoxicity if hypokalemia occurs.  []  Oral hypoglycemic agents (glyburide, glipizide, glimepiride): increased risk of hypoglycemia. Patient's glucose levels should be monitored closely when initiating amiodarone therapy.   [x]  Drugs that prolong the QT interval: Concurrent therapy is contraindicated due to the increased risk of torsades de pointes; . Antibiotics: e.g. fluoroquinolones, erythromycin. . Antiarrhythmics: e.g. quinidine, procainamide, disopyramide, sotalol. . Antipsychotics: e.g. phenothiazines, haloperidol.  . Lithium, tricyclic antidepressants, and methadone. Thank You,  Lance Berland K. Lance Trujillo, PharmD, BCPS.  Clinical Pharmacist Pager [phone]. 01/03/2012 6:45 PM

## 2012-01-04 LAB — BASIC METABOLIC PANEL
Calcium: 8.5 mg/dL (ref 8.4–10.5)
Creatinine, Ser: 0.91 mg/dL (ref 0.50–1.35)
GFR calc Af Amer: >90 mL/min (ref >90)
GFR calc non Af Amer: 81 mL/min — ABNORMAL LOW (ref >90)
Sodium: 136 mEq/L (ref 135–145)

## 2012-01-04 LAB — MAGNESIUM: Magnesium: 2.2 mg/dL (ref 1.5–2.5)

## 2012-01-04 LAB — CBC
HCT: 35.6 % — ABNORMAL LOW (ref 39.0–52.0)
Hemoglobin: 11.9 g/dL — ABNORMAL LOW (ref 13.0–17.0)
MCH: 28.1 pg (ref 26.0–34.0)
MCHC: 33.4 g/dL (ref 30.0–36.0)
MCV: 84 fL (ref 78.0–100.0)
Platelets: 161 10*3/uL (ref 150–400)
RBC: 4.24 MIL/uL (ref 4.22–5.81)
RDW: 14.1 % (ref 11.5–15.5)
WBC: 7.4 10*3/uL (ref 4.0–10.5)

## 2012-01-04 LAB — TSH: TSH: 5.136 u[IU]/mL — ABNORMAL HIGH (ref 0.350–4.500)

## 2012-01-04 MED ORDER — MAGNESIUM HYDROXIDE 400 MG/5ML PO SUSP
30.0000 mL | Freq: Two times a day (BID) | ORAL | Status: DC | PRN
  Administered 2012-01-05: 30 mL via ORAL
  Filled 2012-01-04: qty 30

## 2012-01-04 MED ORDER — AMIODARONE HCL 200 MG PO TABS
200.0000 mg | ORAL_TABLET | Freq: Two times a day (BID) | ORAL | Status: DC
  Administered 2012-01-04 (×2): 200 mg via ORAL
  Filled 2012-01-04 (×4): qty 1

## 2012-01-04 NOTE — Progress Notes (Signed)
CARDIAC REHAB PHASE I   PRE:  Rate/Rhythm: 61 SR  BP:  Supine:   Sitting: 122/60  Standing:    SaO2: 98 RA  MODE:  Ambulation: 1040 ft   POST:  Rate/Rhythem:   BP:  Supine:   Sitting: 135/63  Standing:    SaO2: to bathroom 0930-1030 Assisted X 1 and used walker to ambulate. Gait steady VS stable. Pt without c/o with walking. Discussed Outpt. CRP with pt. He is to decide where he would to attend.Pt to bathroom after walk then to recliner with call light in reach.  Beatrix Fetters

## 2012-01-04 NOTE — Progress Notes (Signed)
4 Days Post-Op Procedure(s) (LRB): CORONARY ARTERY BYPASS GRAFTING (CABG) (N/A)  Subjective:  Lance Trujillo complains of 2 episodes of expressing air through urethra.  He denies chest pain shortness of breath.  He did have more episodes of rapid afib yesterday, but currently NSR  Objective: Vital signs in last 24 hours: Temp:  [97.8 F (36.6 C)-99 F (37.2 C)] 97.8 F (36.6 C) (08/10 0347) Pulse Rate:  [57-65] 57  (08/10 0347) Cardiac Rhythm:  [-] Atrial fibrillation (08/09 1940) Resp:  [18-20] 18  (08/10 0347) BP: (104-114)/(67-72) 104/67 mmHg (08/10 0347) SpO2:  [95 %-96 %] 95 % (08/10 0347) Weight:  [165 lb 2 oz (74.9 kg)] 165 lb 2 oz (74.9 kg) (08/10 0347)  Intake/Output from previous day: 08/09 0701 - 08/10 0700 In: 240 [P.O.:240] Out: 2135 [Urine:2135]  General appearance: alert, cooperative and no distress Heart: regular rate and rhythm Lungs: clear to auscultation bilaterally Abdomen: soft, non-tender; bowel sounds normal; no masses,  no organomegaly Extremities: edema trace Wound: clean and dry  Lab Results:  [facility] 01/04/12 0545 01/03/12 0707  WBC 7.4 9.7  HGB 11.9* 12.3*  HCT 35.6* 35.9*  PLT 161 128*   BMET:  Basename 01/04/12 0545 01/03/12 0707  NA 136 128*  K 4.1 3.8  CL 100 94*  CO2 27 22  GLUCOSE 106* 110*  BUN 12 13  CREATININE 0.91 0.88  CALCIUM 8.5 8.1*    PT/INR: No results found for this basename: LABPROT,INR in the last 72 hours ABG    Component Value Date/Time   PHART 7.378 01/01/2012 0121   HCO3 22.7 01/01/2012 0121   TCO2 22 01/01/2012 1659   ACIDBASEDEF 2.0 01/01/2012 0121   O2SAT 99.0 01/01/2012 0121   CBG (last 3)   Basename 01/03/12 1631 01/03/12 1142 01/03/12 0626  GLUCAP 96 87 104*    Assessment/Plan: S/P Procedure(s) (LRB): CORONARY ARTERY BYPASS GRAFTING (CABG) (N/A)  1. CV- Episodes of rapid A. Fib yesterday, currently NSR will start low dose PO oral Amiodarone with patient's HR being in the 60s, continue Lopressor at current  parameters 2. Pulm- no acute issues, patient off oxygen will continue IS for bibasilar atelectasis 3. GU- patient passing air through urethra, could be due to removal of foley 4. Dispo- will start PO Amiodarone today.  If patient maintains NSR will plan to d/c EPW in AM, with likely d/c home Monday if no problems arise   LOS: 4 days    Lance Trujillo, Lance Trujillo 01/04/2012

## 2012-01-05 DIAGNOSIS — I493 Ventricular premature depolarization: Secondary | ICD-10-CM | POA: Diagnosis present

## 2012-01-05 DIAGNOSIS — R001 Bradycardia, unspecified: Secondary | ICD-10-CM | POA: Diagnosis present

## 2012-01-05 DIAGNOSIS — E876 Hypokalemia: Secondary | ICD-10-CM | POA: Clinically undetermined

## 2012-01-05 DIAGNOSIS — I4891 Unspecified atrial fibrillation: Secondary | ICD-10-CM

## 2012-01-05 DIAGNOSIS — I498 Other specified cardiac arrhythmias: Secondary | ICD-10-CM

## 2012-01-05 DIAGNOSIS — Z951 Presence of aortocoronary bypass graft: Secondary | ICD-10-CM

## 2012-01-05 HISTORY — DX: Unspecified atrial fibrillation: I48.91

## 2012-01-05 LAB — BASIC METABOLIC PANEL
BUN: 12 mg/dL (ref 6–23)
CO2: 25 mEq/L (ref 19–32)
Calcium: 8.3 mg/dL — ABNORMAL LOW (ref 8.4–10.5)
Chloride: 101 mEq/L (ref 96–112)
Creatinine, Ser: 0.85 mg/dL (ref 0.50–1.35)
GFR calc Af Amer: >90 mL/min (ref >90)
GFR calc non Af Amer: 84 mL/min — ABNORMAL LOW (ref >90)
Glucose, Bld: 108 mg/dL — ABNORMAL HIGH (ref 70–99)
Potassium: 3.4 mEq/L — ABNORMAL LOW (ref 3.5–5.1)
Sodium: 135 mEq/L (ref 135–145)

## 2012-01-05 MED ORDER — POTASSIUM CHLORIDE CRYS ER 20 MEQ PO TBCR
40.0000 meq | EXTENDED_RELEASE_TABLET | Freq: Once | ORAL | Status: AC
  Administered 2012-01-05: 40 meq via ORAL
  Filled 2012-01-05: qty 2

## 2012-01-05 MED ORDER — AMIODARONE HCL 200 MG PO TABS
200.0000 mg | ORAL_TABLET | Freq: Every day | ORAL | Status: DC
  Administered 2012-01-05 – 2012-01-07 (×3): 200 mg via ORAL
  Filled 2012-01-05 (×4): qty 1

## 2012-01-05 NOTE — Progress Notes (Signed)
Pt ambulated in hallway 500 ft with rolling walker and tolerated activity well. Will continue to monitor.  

## 2012-01-05 NOTE — Progress Notes (Signed)
Patient ID: Lance Trujillo, male   DOB: 1935-08-10, 76 y.o.   MRN: [id number]   SUBJECTIVE: Patient is recovering post CABG. He had some postoperative atrial fib and amiodarone was added. He has been changed to oral amiodarone. He has been on a very low dose of beta blocker. Historically it is very clear from the patient that he has had resting sinus bradycardia over the years.. I do not know the exact rates. He has never had symptoms. He also has had PVCs scattered over time. Today his heart rate was moving towards the range of 40. He was asymptomatic. His blood pressure was stable. With concern about this attempts were made to be sure that his external pacemaker was working. When pacing attempts were made there were PVCs. He remained stable. Ultimately the pacemaker was turned off and disconnected. It is available for use if needed. The patient's beta blocker was stopped although he did have a dose this morning. He also had a dose of amiodarone this morning.  I reviewed everything about the patient today. In addition I spoke with the patient and his wife and I also spoke with his son who is a Haematologist member in [place]. Surgery here at our hospital.   Filed Vitals:   01/04/12 2031 01/04/12 2213 01/05/12 0443 01/05/12 1102  BP: 113/60  103/63 123/54  Pulse: 61 53 58 61  Temp: 98.2 F (36.8 C)  98.1 F (36.7 C)   TempSrc: Oral  Oral   Resp: 18  18   Height:      Weight:   163 lb 6.4 oz (74.118 kg)   SpO2: 96%  94%     Intake/Output Summary (Last 24 hours) at 01/05/12 1440 Last data filed at 01/04/12 2027  Gross per 24 hour  Intake      0 ml  Output   1000 ml  Net  -1000 ml    LABS: Basic Metabolic Panel:  Basename 01/05/12 0555 01/04/12 0545  NA 135 136  K 3.4* 4.1  CL 101 100  CO2 25 27  GLUCOSE 108* 106*  BUN 12 12  CREATININE 0.85 0.91  CALCIUM 8.3* 8.5  MG -- 2.2  PHOS -- --   Liver Function Tests: No results found for this basename:  AST:2,ALT:2,ALKPHOS:2,BILITOT:2,PROT:2,ALBUMIN:2 in the last 72 hours No results found for this basename: LIPASE:2,AMYLASE:2 in the last 72 hours CBC:  Basename 01/04/12 0545 01/03/12 0707  WBC 7.4 9.7  NEUTROABS -- --  HGB 11.9* 12.3*  HCT 35.6* 35.9*  MCV 84.0 84.1  PLT 161 128*   Cardiac Enzymes: No results found for this basename: CKTOTAL:3,CKMB:3,CKMBINDEX:3,TROPONINI:3 in the last 72 hours BNP: No components found with this basename: POCBNP:3 D-Dimer: No results found for this basename: DDIMER:2 in the last 72 hours Hemoglobin A1C: No results found for this basename: HGBA1C in the last 72 hours Fasting Lipid Panel: No results found for this basename: CHOL,HDL,LDLCALC,TRIG,CHOLHDL,LDLDIRECT in the last 72 hours Thyroid Function Tests:  Basename 01/03/12 1821  TSH 5.136*  T4TOTAL --  T3FREE --  THYROIDAB --    RADIOLOGY: Dg Chest 2 View  01/03/2012  *RADIOLOGY REPORT*  Clinical Data: Follow up cardiac surgery.  CHEST - 2 VIEW  Comparison: 01/02/2012.  Findings: Interval removal of support apparatus.  Postoperative changes CABG.  Basilar atelectasis.  Small bilateral pleural effusions.  Epicardial pacing leads visualized.  Upper abdomen appears within normal limits.  IMPRESSION:  1.  Interval removal of support apparatus. 2.  Expected postoperative appearance of  the chest.  Basilar atelectasis and small bilateral pleural effusions.  Original Report Authenticated By: Andreas Newport, M.D.   Dg Chest 2 View  12/30/2011  *RADIOLOGY REPORT*  Clinical Data: Chest pain, short of breath, pre cardiac catheterization  CHEST - 2 VIEW  Comparison: Chest x-ray of 12/23/2007  Findings: The lungs are clear remains somewhat hyperaerated. Mediastinal contours appear stable.  The heart is within normal limits in size.  There are degenerative changes throughout the thoracic spine.  IMPRESSION: Stable chest x-ray.  No active lung disease.  Original Report Authenticated By: Juline Patch, M.D.   Dg  Chest Portable 1 View In Am  01/02/2012  *RADIOLOGY REPORT*  Clinical Data: Status post CABG  PORTABLE CHEST - 1 VIEW  Comparison: 01/01/2012  Findings: Right IJ sheath remains in place.  Swan-Ganz catheter has been removed.  Evidence of CABG noted.  Left-sided chest tube remains in place.  No pneumothorax.  Decreased gastric distention. Stable plate-like bilateral lower lobe atelectasis noted.  Heart size is upper limits of normal.  Mediastinal drain has been removed.  IMPRESSION: Stable bilateral lower lobe atelectasis.  Decreasing gastric distention.  Original Report Authenticated By: Harrel Lemon, M.D.   Dg Chest Portable 1 View In Am  01/01/2012  *RADIOLOGY REPORT*  Clinical Data: Status post CABG.  PORTABLE CHEST - 1 VIEW  Comparison: Plain film chest 12/31/2011.  Findings: NG tube and ETT tube have been removed.  Support apparatus including a left chest tube is otherwise unchanged. There is no pneumothorax.  Mild bibasilar atelectasis and a small right effusion noted.  Heart size is upper normal.  There is gaseous distention of the stomach.  IMPRESSION:  1.  Status post extubation and NG tube removal. 2.  Mild subsegmental basilar atelectasis and small right effusion. 3.  Gaseous distention of the stomach.  Original Report Authenticated By: Bernadene Bell. D'ALESSIO, M.D.   Dg Chest Portable 1 View  12/31/2011  *RADIOLOGY REPORT*  Clinical Data: Postop CABG.  PORTABLE CHEST - 1 VIEW  Comparison: 12/30/2011  Findings: Interval postoperative changes in the mediastinum with sternotomy wires and surgical clips and vascular markers present. Endotracheal tube was placed with tip measuring about 4.4 cm above the carina.  Enteric tube is coiled in the left upper quadrant. The tip is not visualized consistent with location at least in the distal stomach.  Swan-Ganz catheter with tip over the pulmonary outflow tract.  Left chest tube.  No visible pneumothorax.  Vague linear density over the mediastinum may represent  pneumomediastinum, likely postoperative.  Normal heart size and pulmonary vascularity.  Shallow inspiration with linear atelectasis in both lung bases.  No blunting of costophrenic angles.  IMPRESSION: Appliances appear to be in satisfactory location.  No pneumothorax. Shallow inspiration with linear atelectasis in both lung bases. Postoperative changes.  Original Report Authenticated By: Marlon Pel, M.D.    PHYSICAL EXAM  Patient is oriented to person time and place. Affect is normal. Lungs are clear. Respiratory effort is nonlabored. Cardiac exam her vitals S1 and S2. There no clicks or significant murmurs. The abdomen is soft. His chest is nicely healing.   TELEMETRY:  I have reviewed completely to telemetry today January 05, 2012. He has sinus bradycardia. He does have PVCs and at one point he was in ventricular bigeminy. He has not had any prolonged pauses.   ASSESSMENT AND PLAN:   Left Main Coronary Artery Disease    This has been treated with CABG.   Hx of  CABG    The patient underwent successful CABG this admission.  Normal left ventricular function.    At the time of the catheterization the patient had normal left trigger function. We have no reason to believe that this is changed. If there is any concern, the patient could undergo an echo tomorrow to reassess.   Bradycardia    The patient is currently having significant sinus bradycardia. However he has a long history of bradycardia. He received Lopressor this morning and it has been stopped. He is on amiodarone which has some beta blocker affect. This will be continued.   PVC's (premature ventricular contractions)    At this point it seems unlikely that these PVCs are of significant concern. His potassium is on the low side and potassium has been ordered. It will be careful to treat his potassium and to check his magnesium again.   Atrial fibrillation    The patient has postoperative atrial fibrillation. He is now on 200  mg of amiodarone. The question is whether this needs to be stopped because of his bradycardia. For today I feel it is most prudent to keep him off his Lopressor and to keep him on the amiodarone and watch him very carefully. I did explain to him that ultimately the amiodarone will be stopped later as an outpatient.   Hypokalemia   Potassium was 3.4 this morning and he is receiving potassium. He will be checked again tomorrow along with magnesium.  The patient was concerned because he felt  Without a beta blocker on board he would be at increased risk. I explained to him that we Always want a patient on a beta blocker after they have had myocardial injury. He did not have an MI. . I feel that his bradycardia dictates the fact that he needs to be off a beta blocker. I told him that I thought he was not at increased risk at this time off a beta blocker now that he's been revascularized.   Willa Rough 01/05/2012 2:40 PM

## 2012-01-05 NOTE — Progress Notes (Addendum)
5 Days Post-Op Procedure(s) (LRB): CORONARY ARTERY BYPASS GRAFTING (CABG) (N/A)  Subjective:  Lance Trujillo complains of palpitations this morning.  He states he can tell he is having PVCs.  He is also Bradycardic  Objective: Vital signs in last 24 hours: Temp:  [97.7 F (36.5 C)-98.2 F (36.8 C)] 98.1 F (36.7 C) (08/11 0443) Pulse Rate:  [53-63] 58  (08/11 0443) Cardiac Rhythm:  [-] Sinus bradycardia (08/11 0828) Resp:  [16-18] 18  (08/11 0443) BP: (100-135)/(60-63) 103/63 mmHg (08/11 0443) SpO2:  [94 %-97 %] 94 % (08/11 0443) Weight:  [163 lb 6.4 oz (74.118 kg)] 163 lb 6.4 oz (74.118 kg) (08/11 0443)   Intake/Output from previous day: 08/10 0701 - 08/11 0700 In: 240 [P.O.:240] Out: 1300 [Urine:1300]  General appearance: alert, cooperative and no distress Heart: regular rate and rhythm Lungs: clear to auscultation bilaterally Abdomen: soft, non-tender; bowel sounds normal; no masses,  no organomegaly Extremities: edema trace Wound: clean and dry  Lab Results:  [facility] 01/04/12 0545 01/03/12 0707  WBC 7.4 9.7  HGB 11.9* 12.3*  HCT 35.6* 35.9*  PLT 161 128*   BMET:  Basename 01/05/12 0555 01/04/12 0545  NA 135 136  K 3.4* 4.1  CL 101 100  CO2 25 27  GLUCOSE 108* 106*  BUN 12 12  CREATININE 0.85 0.91  CALCIUM 8.3* 8.5    PT/INR: No results found for this basename: LABPROT,INR in the last 72 hours ABG    Component Value Date/Time   PHART 7.378 01/01/2012 0121   HCO3 22.7 01/01/2012 0121   TCO2 22 01/01/2012 1659   ACIDBASEDEF 2.0 01/01/2012 0121   O2SAT 99.0 01/01/2012 0121   CBG (last 3)   Basename 01/03/12 1631 01/03/12 1142 01/03/12 0626  GLUCAP 96 87 104*    Assessment/Plan: S/P Procedure(s) (LRB): CORONARY ARTERY BYPASS GRAFTING (CABG) (N/A)  1. CV- Previous A.Fib, now NSR with PVCs and Bradycardia- will decrease Amiodarone dose to once daily and continue Lopressor 2. Pulm- no acute issues, encouraged continued IS use 3. Volume Overload- weight is stable,  continue Lasix for now 4. Dispo- patient with bradycardia and PVCs, adjusted Amiodarone will monitor HR today.  If stable can d/c pacing wires in AM, patient is uncomfortable with them being discontinued today.  Otherwise patient stable for discharge tomorrow afternoon and Tuesday   LOS: 5 days    Lowella Dandy 01/05/2012   He is complaining that PVC are making him uneasy- "it feels like my heart is beating too hard" He is not capturing with atrial wires and experiences discomfort when attempting to pace with them With VP @ 70 he feels like every beat is "too hard" He is asymptomatic when HR in 49-59 range and not having PVCs His baseline ECG showed bradycardia with a rate of 45 bpm Will keep pacer at bedside but not attached. D/c lopressor. Will ask Cardiology their opinion on whether this is a manifestation of tachy- brady, or just postop a fib in the setting of baseline bradycardia and any advice on medications

## 2012-01-06 DIAGNOSIS — E039 Hypothyroidism, unspecified: Secondary | ICD-10-CM | POA: Clinically undetermined

## 2012-01-06 LAB — BASIC METABOLIC PANEL
Calcium: 8.5 mg/dL (ref 8.4–10.5)
Creatinine, Ser: 0.86 mg/dL (ref 0.50–1.35)
GFR calc Af Amer: >90 mL/min (ref >90)

## 2012-01-06 LAB — MAGNESIUM: Magnesium: 2.1 mg/dL (ref 1.5–2.5)

## 2012-01-06 NOTE — Progress Notes (Addendum)
[address]            [place] [address]          [phone]     6 Days Post-Op  Procedure(s) (LRB): CORONARY ARTERY BYPASS GRAFTING (CABG) (N/A) Subjective: Feeling ok   Objective  Telemetry sr,sbrady, pvc's  Temp:  [98.3 F (36.8 C)-98.4 F (36.9 C)] 98.3 F (36.8 C) (08/12 0300) Pulse Rate:  [54-61] 58  (08/12 0300) Resp:  [16-18] 18  (08/12 0300) BP: (114-134)/(54-66) 134/63 mmHg (08/12 0300) SpO2:  [94 %-97 %] 97 % (08/12 0300) Weight:  [164 lb 6.4 oz (74.571 kg)] 164 lb 6.4 oz (74.571 kg) (08/12 0300)   Intake/Output Summary (Last 24 hours) at 01/06/12 0757 Last data filed at 01/06/12 0300  Gross per 24 hour  Intake    240 ml  Output   1000 ml  Net   -760 ml       General appearance: alert, cooperative and no distress Heart: regular rate and rhythm, S1, S2 normal and occas extrasystole Lungs: clear to auscultation bilaterally Abdomen: benign Extremities: minor edema Wound: incisions healing well  Lab Results:  Basename 01/05/12 0555 01/04/12 0545  NA 135 136  K 3.4* 4.1  CL 101 100  CO2 25 27  GLUCOSE 108* 106*  BUN 12 12  CREATININE 0.85 0.91  CALCIUM 8.3* 8.5  MG -- 2.2  PHOS -- --   No results found for this basename: AST:2,ALT:2,ALKPHOS:2,BILITOT:2,PROT:2,ALBUMIN:2 in the last 72 hours No results found for this basename: LIPASE:2,AMYLASE:2 in the last 72 hours  Basename 01/04/12 0545  WBC 7.4  NEUTROABS --  HGB 11.9*  HCT 35.6*  MCV 84.0  PLT 161   No results found for this basename: CKTOTAL:4,CKMB:4,TROPONINI:4 in the last 72 hours No components found with this basename: POCBNP:3 No results found for this basename: DDIMER in the last 72 hours No results found for this basename: HGBA1C in the last 72 hours No results found for this basename: CHOL,HDL,LDLCALC,TRIG,CHOLHDL in the last 72 hours  Basename 01/03/12 1821  TSH 5.136*  T4TOTAL --  T3FREE --  THYROIDAB --   No results found for this  basename: VITAMINB12,FOLATE,FERRITIN,TIBC,IRON,RETICCTPCT in the last 72 hours  Medications: Scheduled    . amiodarone  200 mg Oral Daily  . aspirin EC  325 mg Oral Daily  . atorvastatin  10 mg Oral q1800  . docusate sodium  200 mg Oral Daily  . enoxaparin  40 mg Subcutaneous Q24H  . pantoprazole  40 mg Oral QAC breakfast  . potassium chloride  40 mEq Oral Once  . potassium chloride  40 mEq Oral Once  . sodium chloride  3 mL Intravenous Q12H  . DISCONTD: amiodarone  200 mg Oral BID  . DISCONTD: metoprolol tartrate  12.5 mg Oral BID     Radiology/Studies:  No results found.  INR: Will add last result for INR, ABG once components are confirmed Will add last 4 CBG results once components are confirmed  Assessment/Plan: S/P Procedure(s) (LRB): CORONARY ARTERY BYPASS GRAFTING (CABG) (N/A)    1. Doing well, cont to observe rhythm off beta blocker, remove epw's soon? 2 TSH mildly elevated, consider supplement vs further testing 3 k+ low yesterday, repeat in am 4 pulm toilet /rehab   LOS: 6 days    Lance Trujillo,Lance Trujillo 8/12/20137:57 AM    Have stopped beta blocker with bradycardia- will d/c  home not on beta blocker because of intolerance D/c pacing wires Home in am I have seen and examined Lance Trujillo and agree with the above assessment  and plan.  Delight Ovens MD Beeper [phone] Office [phone] 01/06/2012 8:41 AM

## 2012-01-06 NOTE — Progress Notes (Signed)
Patient Name: Lance Trujillo      SUBJECTIVE:s/p CABG with normal LV function; hx of brady withpostop afib>>AMIO with subsequent brady>>HR 405-50 and betablockers held HR overnight mostly in 50-60s Not on anticoagulation  TERF>>HTN, CAD age for [facility] of 3 CHADs close to 2  Past Medical History  Diagnosis Date  . Prostate cancer     s/p robotic prostatectomy per Dr. Laverle Patter  . HTN (hypertension)   . HLD (hyperlipidemia)   . Bradycardia     PHYSICAL EXAM Filed Vitals:   01/05/12 1102 01/05/12 1500 01/05/12 2153 01/06/12 0300  BP: 123/54 114/66 123/66 134/63  Pulse: 61 54 58 58  Temp:  98.3 F (36.8 C) 98.4 F (36.9 C) 98.3 F (36.8 C)  TempSrc:  Oral Oral Oral  Resp:  16 18 18   Height:      Weight:    164 lb 6.4 oz (74.571 kg)  SpO2:  97% 94% 97%   Well developed and nourished in no acute distress HENT normal Neck supple with JVP-flat Carotids brisk and full without bruits Clear Regular rate and rhythm, no murmurs or gallops Abd-soft with active BS without hepatomegaly No Clubbing cyanosis edema Skin-warm and dry A & Oriented  Grossly normal sensory and motor function   TELEMETRY: Reviewed telemetry pt in NSR with nonsustained Atach with aberrant  Conduction   I dont think this is VT-NS:    Intake/Output Summary (Last 24 hours) at 01/06/12 1226 Last data filed at 01/06/12 0300  Gross per 24 hour  Intake    240 ml  Output   1000 ml  Net   -760 ml    LABS: Basic Metabolic Panel:  Lab 01/06/12 1610 01/05/12 0555 01/04/12 0545 01/03/12 0707 01/02/12 0345 01/01/12 1700 01/01/12 1659 01/01/12 0400 12/30/11 1302  NA 136 135 136 128* 131* -- 135 135 --  K 4.6 3.4* 4.1 3.8 3.9 -- 3.4* 3.7 --  CL 105 101 100 94* 99 -- 98 104 --  CO2 22 25 27 22 23  -- -- 23 29  GLUCOSE 91 108* 106* 110* 128* -- 128* 167* --  BUN 11 12 12 13 13  -- 8 7 --  CREATININE 0.86 0.85 0.91 0.88 0.83 0.78 1.00 -- --  CALCIUM 8.5 8.3* -- -- -- -- -- -- --  MG 2.1 -- 2.2 -- -- -- --  -- --  PHOS -- -- -- -- -- -- -- -- --   Cardiac Enzymes: No results found for this basename: CKTOTAL:3,CKMB:3,CKMBINDEX:3,TROPONINI:3 in the last 72 hours CBC:  Lab 01/04/12 0545 01/03/12 0707 01/02/12 0345 01/01/12 1700 01/01/12 1659 01/01/12 0400 12/31/11 2200 12/31/11 1924 12/30/11 1302  WBC 7.4 9.7 11.7* 11.6* -- 10.4 8.2 -- 5.7  NEUTROABS -- -- -- -- -- -- -- -- 4.4  HGB 11.9* 12.3* 12.8* 13.3 12.9* 13.6 12.6* -- --  HCT 35.6* 35.9* 36.9* 38.4* 38.0* 38.6* 35.9* -- --  MCV 84.0 84.1 82.2 81.9 -- 82.0 82.0 -- 85.7  PLT 161 128* 112* 131* -- 136* 92* 92* --    Thyroid Function Tests:  Basename 01/03/12 1821  TSH 5.136*  T4TOTAL --  T3FREE --  THYROIDAB --   Anemia Panel: No results found for this basename: VITAMINB12,FOLATE,FERRITIN,TIBC,IRON,RETICCTPCT in the last 72 hours     ASSESSMENT AND PLAN:  Patient Active Hospital Problem List:   Left Main Coronary Artery Disease (01/01/2012)     Bradycardia (01/05/2012)   PVC's (premature ventricular contractions) (01/05/2012)  * Atrial fibrillation (01/05/2012)  Hypokalemia (01/05/2012)    Pt with postop afib now with just nonsustained atrial tach;  Would continue amio, but will have to make a decision in first few weeks as to whether to continue.  If so, would prob use 30d monitor to follow for bradycardia as amio will accumulate over time.  THis might also be useful in terms of identifying atrial fibrillation and the assoc issues of anticoagulation   Amiodarone side effects were reviewed in detail; including its relation to hypothyroidism and the need for ongoing surveillance of TSH  Signed, Sherryl Manges MD  01/06/2012

## 2012-01-06 NOTE — Progress Notes (Signed)
External pacing wires removed without difficulty.  Tolerated procedure well. Painted with betadine and left uncovered. No s/s of infection noted, no c/o voiced. Removed per policy.

## 2012-01-06 NOTE — Progress Notes (Addendum)
CARDIAC REHAB PHASE I   PRE:  Rate/Rhythm: 59SB  BP:  Supine:   Sitting: 117/61  Standing:    SaO2: 97%RA  MODE:  Ambulation: 690 ft   POST:  Rate/Rhythem: 70SR  BP:  Supine:   Sitting: 139/66  Standing:    SaO2: 94%RA 1118-1151 Pt walked 690 ft on RA with rolling walker with steady gait. Stopped once to rest due to SOB. Pt did not know if he wants a rolling walker for home use. States he has one at home but he is not sure where it is. Told him we could get him one if needed. To recliner after walk. Wants referral for CRP 2 to [place] , [place], and [facility]. Tolerated walk well.  Duanne Limerick

## 2012-01-06 NOTE — Progress Notes (Signed)
Pt ambulated almost 700 ft using walker on room air tolerated well. Grayer Sproles RN

## 2012-01-07 LAB — BASIC METABOLIC PANEL
Calcium: 8.8 mg/dL (ref 8.4–10.5)
Creatinine, Ser: 0.98 mg/dL (ref 0.50–1.35)
GFR calc non Af Amer: 79 mL/min — ABNORMAL LOW (ref >90)
Glucose, Bld: 101 mg/dL — ABNORMAL HIGH (ref 70–99)
Sodium: 136 mEq/L (ref 135–145)

## 2012-01-07 LAB — PROTIME-INR
INR: 1.16 (ref 0.00–1.49)
Prothrombin Time: 15 seconds (ref 11.6–15.2)

## 2012-01-07 LAB — PSA: PSA: 0.02 ng/mL — ABNORMAL LOW (ref <=4.00)

## 2012-01-07 MED ORDER — METOPROLOL TARTRATE 12.5 MG HALF TABLET
12.5000 mg | ORAL_TABLET | Freq: Once | ORAL | Status: AC
  Administered 2012-01-07: 12.5 mg via ORAL
  Filled 2012-01-07: qty 1

## 2012-01-07 MED ORDER — AMIODARONE HCL 200 MG PO TABS
200.0000 mg | ORAL_TABLET | Freq: Once | ORAL | Status: AC
  Administered 2012-01-07: 200 mg via ORAL
  Filled 2012-01-07: qty 1

## 2012-01-07 MED ORDER — PINDOLOL 5 MG PO TABS
2.5000 mg | ORAL_TABLET | Freq: Two times a day (BID) | ORAL | Status: DC
  Administered 2012-01-07 – 2012-01-08 (×4): 2.5 mg via ORAL
  Administered 2012-01-09: 10:00:00 via ORAL
  Filled 2012-01-07 (×6): qty 1

## 2012-01-07 MED ORDER — AMIODARONE HCL 200 MG PO TABS
400.0000 mg | ORAL_TABLET | Freq: Two times a day (BID) | ORAL | Status: DC
  Administered 2012-01-07 – 2012-01-09 (×4): 400 mg via ORAL
  Filled 2012-01-07 (×5): qty 2

## 2012-01-07 MED ORDER — WARFARIN SODIUM 5 MG PO TABS
5.0000 mg | ORAL_TABLET | Freq: Every day | ORAL | Status: DC
  Administered 2012-01-07 – 2012-01-08 (×2): 5 mg via ORAL
  Filled 2012-01-07 (×3): qty 1

## 2012-01-07 MED ORDER — ENOXAPARIN SODIUM 40 MG/0.4ML ~~LOC~~ SOLN
40.0000 mg | Freq: Two times a day (BID) | SUBCUTANEOUS | Status: DC
  Administered 2012-01-07 – 2012-01-08 (×4): 40 mg via SUBCUTANEOUS
  Filled 2012-01-07 (×6): qty 0.4

## 2012-01-07 MED ORDER — WARFARIN VIDEO
Freq: Once | Status: AC
  Administered 2012-01-07: 15:00:00

## 2012-01-07 MED ORDER — COUMADIN BOOK
Freq: Once | Status: AC
  Administered 2012-01-07: 15:00:00
  Filled 2012-01-07: qty 1

## 2012-01-07 MED ORDER — WARFARIN - PHYSICIAN DOSING INPATIENT
Freq: Every day | Status: DC
  Administered 2012-01-08: 18:00:00

## 2012-01-07 NOTE — Progress Notes (Addendum)
7 Days Post-Op Procedure(s) (LRB): CORONARY ARTERY BYPASS GRAFTING (CABG) (N/A)  Subjective:  Mr. Lance Trujillo has no new complaints this morning.  He converted back into rapid Atrial Fibrillation this morning.   Objective: Vital signs in last 24 hours: Temp:  [97.9 F (36.6 C)-98.6 F (37 C)] 98.2 F (36.8 C) (08/13 0504) Pulse Rate:  [59-70] 70  (08/13 0504) Cardiac Rhythm:  [-] Normal sinus rhythm (08/12 1930) Resp:  [18-20] 20  (08/13 0504) BP: (106-127)/(61-64) 127/63 mmHg (08/13 0504) SpO2:  [95 %-97 %] 95 % (08/13 0504) Weight:  [159 lb 2.8 oz (72.2 kg)] 159 lb 2.8 oz (72.2 kg) (08/13 0504)  Intake/Output from previous day: 08/12 0701 - 08/13 0700 In: 720 [P.O.:720] Out: 1050 [Urine:1050]  General appearance: alert, cooperative and no distress Heart: regular rate and rhythm Lungs: clear to auscultation bilaterally Abdomen: soft, non-tender; bowel sounds normal; no masses,  no organomegaly Extremities: edema trace Wound: clean and dry  Lab Results: No results found for this basename: WBC:2,HGB:2,HCT:2,PLT:2 in the last 72 hours BMET:  [facility] 01/07/12 0545 01/06/12 0654  NA 136 136  K 4.3 4.6  CL 102 105  CO2 25 22  GLUCOSE 101* 91  BUN 13 11  CREATININE 0.98 0.86  CALCIUM 8.8 8.5    PT/INR: No results found for this basename: LABPROT,INR in the last 72 hours ABG    Component Value Date/Time   PHART 7.378 01/01/2012 0121   HCO3 22.7 01/01/2012 0121   TCO2 22 01/01/2012 1659   ACIDBASEDEF 2.0 01/01/2012 0121   O2SAT 99.0 01/01/2012 0121   CBG (last 3)  No results found for this basename: GLUCAP:3 in the last 72 hours  Assessment/Plan: S/P Procedure(s) (LRB): CORONARY ARTERY BYPASS GRAFTING (CABG) (N/A)  1. CV- Atrial Fibrillation this morning, rate 130s-140s, received one dose of Lopressor this morning and Amiodarone 200mg - may benefit from IV Lopressor 2. Pulm- no acute issues, continue IS 3. Hypokalemia- resolved 4. Dispo- patient off beta blocker due to  bradycardia, remains in A. Fib this morning rate 130s-140s. Will follow   LOS: 7 days    Lowella Dandy 01/07/2012   Back in afib early today, discussed with Dr Clide Cliff, will start coumadin  Hold on d/c today I have seen and examined Lance Trujillo and agree with the above assessment  and plan.  Delight Ovens MD Beeper [phone] Office [phone] 01/07/2012 4:10 PM

## 2012-01-07 NOTE — Progress Notes (Signed)
Patient Name: Lance Trujillo      SUBJECTIVE:s/p CABG with normal LV function; hx of brady withpostop afib>>AMIO with subsequent brady>>HR 405-50 and betablockers held HR now over 100 with recurrence of afib Not on anticoagulation  TERF>>HTN, CAD age for [facility] of 3 CHADs close to 2  Past Medical History  Diagnosis Date  . Prostate cancer     s/p robotic prostatectomy per Dr. Laverle Patter  . HTN (hypertension)   . HLD (hyperlipidemia)   . Bradycardia     PHYSICAL EXAM Filed Vitals:   01/06/12 0300 01/06/12 1316 01/06/12 1940 01/07/12 0504  BP: 134/63 108/64 106/61 127/63  Pulse: 58 61 59 70  Temp: 98.3 F (36.8 C) 97.9 F (36.6 C) 98.6 F (37 C) 98.2 F (36.8 C)  TempSrc: Oral Oral Oral Oral  Resp: 18 18 20 20   Height:      Weight: 164 lb 6.4 oz (74.571 kg)   159 lb 2.8 oz (72.2 kg)  SpO2: 97% 97% 96% 95%   Well developed and nourished in no acute distress HENT normal Neck supple with JVP-7-8 Carotids brisk and full without bruits Clear Irregular rate and rhythm, no murmurs or gallops Abd-soft with active BS without hepatomegaly No Clubbing cyanosis trace edema Skin-warm and dry A & Oriented  Grossly normal sensory and motor function   TELEMETRY: Reviewed telemetry pt in afib with rates >>125   Intake/Output Summary (Last 24 hours) at 01/07/12 0810 Last data filed at 01/07/12 0511  Gross per 24 hour  Intake    480 ml  Output   1050 ml  Net   -570 ml    LABS: Basic Metabolic Panel:  Lab 01/07/12 9604 01/06/12 0654 01/05/12 0555 01/04/12 0545 01/03/12 0707 01/02/12 0345 01/01/12 1700 01/01/12 1659 01/01/12 0400  NA 136 136 135 136 128* 131* -- 135 --  K 4.3 4.6 3.4* 4.1 3.8 3.9 -- 3.4* --  CL 102 105 101 100 94* 99 -- 98 --  CO2 25 22 25 27 22 23  -- -- 23  GLUCOSE 101* 91 108* 106* 110* 128* -- 128* --  BUN 13 11 12 12 13 13  -- 8 --  CREATININE 0.98 0.86 0.85 0.91 0.88 0.83 0.78 -- --  CALCIUM 8.8 8.5 -- -- -- -- -- -- --  MG -- 2.1 -- 2.2 -- -- --  -- --  PHOS -- -- -- -- -- -- -- -- --   Cardiac Enzymes: No results found for this basename: CKTOTAL:3,CKMB:3,CKMBINDEX:3,TROPONINI:3 in the last 72 hours CBC:  Lab 01/04/12 0545 01/03/12 0707 01/02/12 0345 01/01/12 1700 01/01/12 1659 01/01/12 0400 12/31/11 2200 12/31/11 1924  WBC 7.4 9.7 11.7* 11.6* -- 10.4 8.2 --  NEUTROABS -- -- -- -- -- -- -- --  HGB 11.9* 12.3* 12.8* 13.3 12.9* 13.6 12.6* --  HCT 35.6* 35.9* 36.9* 38.4* 38.0* 38.6* 35.9* --  MCV 84.0 84.1 82.2 81.9 -- 82.0 82.0 --  PLT 161 128* 112* 131* -- 136* 92* 92*    Thyroid Function Tests: No results found for this basename: TSH,T4TOTAL,FREET3,T3FREE,THYROIDAB in the last 72 hours Anemia Panel: No results found for this basename: VITAMINB12,FOLATE,FERRITIN,TIBC,IRON,RETICCTPCT in the last 72 hours     ASSESSMENT AND PLAN:  Patient Active Hospital Problem List:   Left Main Coronary Artery Disease (01/01/2012)     Bradycardia (01/05/2012)   PVC's (premature ventricular contractions) (01/05/2012)  * Atrial fibrillation (01/05/2012)   Hypokalemia (01/05/2012)    Pt with postop afib now with just nonsustained atrial  tach;   Will increase amio; received lopressor this am,.  We can control the HR but will have to see what happens to sinus    Will also address with Dr Karl Pock re anticoagulation   Amiodarone side effects were reviewed in detail; including its relation to hypothyroidism and the need for ongoing surveillance of TSH  Signed, Sherryl Manges MD  01/07/2012

## 2012-01-07 NOTE — Progress Notes (Signed)
Pt. is on atrial fibrillation with HR at 110 to 130s,B/P 110/71mmHg,Dr.Gerhart made aware with order to give early the schedule dose of amiodarone today and lopressor PO 12.5 mg one dose.Will continue to monitor. Phyllis Whitefield RN

## 2012-01-07 NOTE — Progress Notes (Signed)
CARDIAC REHAB PHASE I   PRE:  Rate/Rhythm: 108-130s afib    BP: sitting 117/80    SaO2: 97 RA  MODE:  Ambulation: 890 ft   POST:  Rate/Rhythm: 140s afib    BP: sitting 104/81     SaO2: 93 2L  Tolerated well, steady. sts he only feels afib when it gets high. Not symptomatic walking. Used 2L O2 for assist and pt feels like that helped him tolerate better. Will f/u.  [id number]  Harriet Masson CES, ACSM

## 2012-01-08 MED ORDER — PINDOLOL 5 MG PO TABS: 2.5000 mg | ORAL_TABLET | Freq: Two times a day (BID) | ORAL | Status: DC

## 2012-01-08 MED ORDER — WARFARIN SODIUM 5 MG PO TABS: ORAL_TABLET | ORAL | Status: DC

## 2012-01-08 NOTE — Progress Notes (Signed)
   Patient Name: Lance Trujillo      SUBJECTIVE:feels quite well  This am without sob or cp  Appreciates sinus  Past Medical History  Diagnosis Date  . Prostate cancer     s/p robotic prostatectomy per Dr. Laverle Patter  . HTN (hypertension)   . HLD (hyperlipidemia)   . Bradycardia     PHYSICAL EXAM Filed Vitals:   01/07/12 1321 01/07/12 2020 01/08/12 0515 01/08/12 0646  BP:  99/55 103/56 97/59  Pulse: 138 61 56   Temp:  98.2 F (36.8 C) 98 F (36.7 C)   TempSrc:  Oral Oral   Resp:  20 18   Height:      Weight:   158 lb 8.2 oz (71.9 kg)   SpO2:  95% 97%    Well developed and nourished in no acute distress HENT normal Neck supple with JVP-flat Clear Regular rate and rhythm, no murmurs or gallops Abd-soft with active BS No Clubbing cyanosis edema Skin-warm and dry A & Oriented  Grossly normal sensory and motor function   TELEMETRY: Reviewed telemetry pt in NSR since conversion yesterday  HR in 50-60   Intake/Output Summary (Last 24 hours) at 01/08/12 0937 Last data filed at 01/07/12 2300  Gross per 24 hour  Intake    240 ml  Output   1475 ml  Net  -1235 ml    LABS: Basic Metabolic Panel:  Lab 01/07/12 1610 01/06/12 0654 01/05/12 0555 01/04/12 0545 01/03/12 0707 01/02/12 0345 01/01/12 1700 01/01/12 1659  NA 136 136 135 136 128* 131* -- 135  K 4.3 4.6 3.4* 4.1 3.8 3.9 -- 3.4*  CL 102 105 101 100 94* 99 -- 98  CO2 25 22 25 27 22 23  -- --  GLUCOSE 101* 91 108* 106* 110* 128* -- 128*  BUN 13 11 12 12 13 13  -- 8  CREATININE 0.98 0.86 0.85 0.91 0.88 0.83 0.78 --  CALCIUM 8.8 8.5 -- -- -- -- -- --  MG -- 2.1 -- 2.2 -- -- -- --  PHOS -- -- -- -- -- -- -- --   Cardiac Enzymes: No results found for this basename: CKTOTAL:3,CKMB:3,CKMBINDEX:3,TROPONINI:3 in the last 72 hours CBC:  Lab 01/04/12 0545 01/03/12 0707 01/02/12 0345 01/01/12 1700 01/01/12 1659  WBC 7.4 9.7 11.7* 11.6* --  NEUTROABS -- -- -- -- --  HGB 11.9* 12.3* 12.8* 13.3 12.9*  HCT 35.6* 35.9* 36.9*  38.4* 38.0*  MCV 84.0 84.1 82.2 81.9 --  PLT 161 128* 112* 131* --   PROTIME:  Basename 01/08/12 0510 01/07/12 1302  LABPROT 15.7* 15.0  INR 1.22 1.16   Liver Function Tests:     ASSESSMENT AND PLAN:  Patient Active Hospital Problem List:   Left Main Coronary Artery Disease (01/01/2012)   CABG (01/05/2012)   Bradycardia (01/05/2012)   PVC's (premature ventricular contractions) (01/05/2012)   Atrial fibrillation (01/05/2012)   Hypokalemia (01/05/2012)   Hypothyroidism- TSH 5.1 on 01/05/2012 (01/06/2012)   Continue amio pindolol   Now on coumadin    Signed, Sherryl Manges MD  01/08/2012

## 2012-01-08 NOTE — Progress Notes (Addendum)
8 Days Post-Op Procedure(s) (LRB): CORONARY ARTERY BYPASS GRAFTING (CABG) (N/A)  Subjective:  Mr. Lance Trujillo has no complaints this morning.  He converted to NSR yesterday afternoon.  He would like to remain in hospital today and aim for discharge in the morning.  Objective: Vital signs in last 24 hours: Temp:  [98 F (36.7 C)-98.2 F (36.8 C)] 98 F (36.7 C) (08/14 0515) Pulse Rate:  [56-138] 56  (08/14 0515) Cardiac Rhythm:  [-] Normal sinus rhythm;Sinus bradycardia (08/13 1950) Resp:  [18-20] 18  (08/14 0515) BP: (97-148)/(55-82) 97/59 mmHg (08/14 0646) SpO2:  [95 %-97 %] 97 % (08/14 0515) Weight:  [158 lb 8.2 oz (71.9 kg)] 158 lb 8.2 oz (71.9 kg) (08/14 0515)  Intake/Output from previous day: 08/13 0701 - 08/14 0700 In: 240 [P.O.:240] Out: 1475 [Urine:1475]  General appearance: alert, cooperative and no distress Heart: regular rate and rhythm Lungs: clear to auscultation bilaterally Abdomen: soft, non-tender; bowel sounds normal; no masses,  no organomegaly Extremities: edema trace Wound: clean and dry  Lab Results: No results found for this basename: WBC:2,HGB:2,HCT:2,PLT:2 in the last 72 hours BMET:  [facility] 01/07/12 0545 01/06/12 0654  NA 136 136  K 4.3 4.6  CL 102 105  CO2 25 22  GLUCOSE 101* 91  BUN 13 11  CREATININE 0.98 0.86  CALCIUM 8.8 8.5    PT/INR:  Basename 01/08/12 0510  LABPROT 15.7*  INR 1.22   ABG    Component Value Date/Time   PHART 7.378 01/01/2012 0121   HCO3 22.7 01/01/2012 0121   TCO2 22 01/01/2012 1659   ACIDBASEDEF 2.0 01/01/2012 0121   O2SAT 99.0 01/01/2012 0121   CBG (last 3)  No results found for this basename: GLUCAP:3 in the last 72 hours  Assessment/Plan: S/P Procedure(s) (LRB): CORONARY ARTERY BYPASS GRAFTING (CABG) (N/A)  1. CV- previous A.Fib, NSR currently on Amiodarone 400mg  BID, will continue to hold Beta Blocker due to Bradycardia 2. Pulm- no acute issues, continue IS 3. INR 1.22 will continue 5mg  Coumadin nightly 4. Dispo-  patient progressing, currently NSR, on coumadin for persistent episodes of Atrial Fibrillation, if maintains NSR will plan for d/c in AM   LOS: 8 days    BARRETT, ERIN 01/08/2012   In sinus now Home on coumadin with recurrent  afib I have seen and examined Caleen Essex and agree with the above assessment  and plan.  Delight Ovens MD Beeper [phone] Office [phone] 01/08/2012 7:57 PM

## 2012-01-08 NOTE — Progress Notes (Signed)
  Amiodarone Drug - Drug Interaction Consult Note  Recommendations: Monitor protime/INR with any changes in amiodarone therapy.  Amiodarone is metabolized by the cytochrome P450 system and therefore has the potential to cause many drug interactions. Amiodarone has an average plasma half-life of 50 days (range 20 to 100 days).   There is potential for drug interactions to occur several weeks or months after stopping treatment and the onset of drug interactions may be slow after initiating amiodarone.   []  Statins: Increased risk of myopathy. Simvastatin- restrict dose to 20mg  daily. Other statins: counsel patients to report any muscle pain or weakness immediately.  [x]  Anticoagulants: Amiodarone can increase anticoagulant effect. Consider warfarin dose reduction. Patients should be monitored closely and the dose of anticoagulant altered accordingly, remembering that amiodarone levels take several weeks to stabilize.  []  Antiepileptics: Amiodarone can increase plasma concentration of phenytoin, phenytoin dose should be reduced. Note that small changes in phenytoin dose can result in large changes in phenytoin levels. Monitor patient closely and counsel on signs of toxicity.  [x]  Beta blockers: increased risk of bradycardia, AV block and myocardial depression. Sotalol - avoid concomitant use.  []   Calcium channel blockers (diltiazem and verapamil): increased risk of bradycardia, AV block and myocardial depression.  []   Cyclosporine: Amiodarone increases levels of cyclosporine. Reduced dose of cyclosporine is recommended.  []  Digoxin dose should be halved when amiodarone is started.  []  Diuretics: increased risk of cardiotoxicity if hypokalemia occurs.  []  Oral hypoglycemic agents (glyburide, glipizide, glimepiride): increased risk of hypoglycemia. Patient's glucose levels should be monitored closely when initiating amiodarone therapy.   []  Drugs that prolong the QT interval: Concurrent  therapy is contraindicated due to the increased risk of torsades de pointes; . Antibiotics: e.g. fluoroquinolones, erythromycin. . Antiarrhythmics: e.g. quinidine, procainamide, disopyramide, sotalol. . Antipsychotics: e.g. phenothiazines, haloperidol.  . Lithium, tricyclic antidepressants, and methadone. Thank You,  Talbert Cage Poteet  01/08/2012 11:17 AM

## 2012-01-08 NOTE — Discharge Summary (Signed)
[address]            [place] [address]          [phone]         Discharge Summary-Addendum  Name: Lance Trujillo DOB: [birth year] 76 y.o. MRN: [id number]   Admission Date: 12/31/2011 Discharge Date:     Additional Discharge Diagnoses:  Atrial fibrillation Bradycardia   Additional Hospital Course:   The patient was originally stable and ready for discharge home on 01/04/2012.  However, he continued to have rhythm issues, with bradycardia and atrial fibrillation, and remained in the hospital for further treatment.  He was seen by [facility], and his Lopressor was discontinued.  He continued to have intermittent runs of nonsustained atrial tachycardia and atrial fibrillation, and was started on Coumadin and  Pindolol.  He is now in sinus rhythm, and he is being monitored closely for any additional bradycardia.   He has otherwise remained stable since the previous discharge summary.  His INR is slowly trending upward.  His pacing wires have been removed.  He is ambulating in the halls without difficulty.   We anticipate discharge within the next 24 hours provided he remains in sinus and no other acute changes occur.           Recent vital signs:  Filed Vitals:   01/08/12 0646  BP: 97/59  Pulse:   Temp:   Resp:     Recent laboratory studies:  CBC:No results found for this basename: WBC:2,HGB:2,HCT:2,PLT:2 in the last 72 hours BMET:  [facility] 01/07/12 0545 01/06/12 0654  NA 136 136  K 4.3 4.6  CL 102 105  CO2 25 22  GLUCOSE 101* 91  BUN 13 11  CREATININE 0.98 0.86  CALCIUM 8.8 8.5    PT/INR:  Basename 01/08/12 0510  LABPROT 15.7*  INR 1.22     Discharge Medications:   Medication List  As of 01/08/2012 10:30 AM   STOP taking these medications         hydrochlorothiazide 50 MG tablet      nitroGLYCERIN 0.4 MG SL tablet      TOPROL XL 25 MG 24 hr tablet         TAKE these medications        amiodarone 200 MG tablet   Commonly known as: PACERONE   Take 2 tablets (400 mg total) by mouth daily. Take Amiodarone 400 mg po two times daily for one week;then take Amiodarone 400 mg po daily thereafter      aspirin 81 MG EC tablet   Take 1 tablet (81 mg total) by mouth daily. Swallow whole.      atorvastatin 10 MG tablet   Commonly known as: LIPITOR   Take 10 mg by mouth daily.      OVER THE COUNTER MEDICATION   Take 1 tablet by mouth daily. Takes Airborne daily      oxyCODONE 5 MG immediate release tablet   Commonly known as: Oxy IR/ROXICODONE   Take 1 tablet (5 mg total) by mouth every 4 (four) hours as needed for pain.      pindolol 5 MG tablet   Commonly known as: VISKEN   Take 0.5 tablets (2.5 mg total) by mouth 2 (two) times daily.      warfarin 5 MG tablet   Commonly known as: COUMADIN   Take 5 mg po  daily or as directed by Coumadin Clinic             Discharge Instructions:  The patient is to refrain from driving, heavy lifting or strenuous activity.  May shower daily and clean incisions with soap and water.  May resume regular diet.   Follow Up:  Discharge Orders    Future Appointments: Provider: Department: Dept Phone: Center:   01/30/2012 10:45 AM Delight Ovens, MD [facility] [phone] [facility]     Future Orders Please Complete By Expires   Amb Referral to Cardiac Rehabilitation      Comments:   Referring to      Follow-up Information    Follow up with Peter Swaziland, MD. (Call for a follow up appointment for 2 weeks)    Contact information:   [address], [address] [place] [address] [phone]       Follow up with Delight Ovens, MD on 01/30/2012. (Have a chest x-ray at [facility] at 9:30, then see MD at 10:30)    Contact information:   [address] [employer] [address] [place] [address] [phone]       Follow up with Medical doctor. (Call for a follow up regarding further surveillance of  HGA1C 5.7)       Follow up with [facility]. (Have a PT/INR drawn for management of Coumadin within 48 hours of discharge at [facility])    Contact information:   [address] [place] [address] [phone]          [name] H 01/08/2012, 10:30 AM

## 2012-01-08 NOTE — Progress Notes (Signed)
CARDIAC REHAB PHASE I   PRE:  Rate/Rhythm: 53 SB  BP:  Supine:   Sitting: 100/60  Standing:    SaO2: 98 RA  MODE:  Ambulation: 1040 ft   POST:  Rate/Rhythem: 60 SR  BP:  Supine:   Sitting: 118/70  Standing:    SaO2: 99 RA 1110-1145 Tolerated ambulation well without c/o. Pt's gait steady without walker. VS stable Pt back to recliner after walk with call light in reach.Gave pt heart healthy diet guidelines.  Beatrix Fetters

## 2012-01-09 ENCOUNTER — Encounter (HOSPITAL_COMMUNITY): Payer: Self-pay | Admitting: Internal Medicine

## 2012-01-09 LAB — PROTIME-INR
INR: 1.66 — ABNORMAL HIGH (ref 0.00–1.49)
Prothrombin Time: 19.9 seconds — ABNORMAL HIGH (ref 11.6–15.2)

## 2012-01-09 MED ORDER — AMIODARONE HCL 400 MG PO TABS: 400.0000 mg | ORAL_TABLET | Freq: Two times a day (BID) | ORAL | Status: DC

## 2012-01-09 NOTE — Progress Notes (Signed)
   Patient Name: Lance Trujillo      SUBJECTIVE:feels quite well  This am without sob or cp  Appreciates sinus  Past Medical History  Diagnosis Date  . Prostate cancer     s/p robotic prostatectomy per Dr. Laverle Patter  . HTN (hypertension)   . HLD (hyperlipidemia)   . Bradycardia     PHYSICAL EXAM Filed Vitals:   01/08/12 1319 01/08/12 1943 01/09/12 0404 01/09/12 0432  BP: 96/57 97/62  100/70  Pulse: 57 54  56  Temp: 98.5 F (36.9 C) 98.4 F (36.9 C)  98.5 F (36.9 C)  TempSrc: Oral Oral  Oral  Resp: 18 20  16   Height:      Weight:   159 lb 13.3 oz (72.5 kg)   SpO2: 97% 95%  96%   Well developed and nourished in no acute distress HENT normal Neck supple with JVP-flat Clear Regular rate and rhythm, no murmurs or gallops Abd-soft with active BS No Clubbing cyanosis edema Skin-warm and dry A & Oriented  Grossly normal sensory and motor function   TELEMETRY: Reviewed telemetry pt in NSR since conversion yesterday  HR in 50-60   Intake/Output Summary (Last 24 hours) at 01/09/12 0729 Last data filed at 01/08/12 1630  Gross per 24 hour  Intake    840 ml  Output      0 ml  Net    840 ml    LABS: Basic Metabolic Panel:  Lab 01/07/12 1610 01/06/12 0654 01/05/12 0555 01/04/12 0545 01/03/12 0707  NA 136 136 135 136 128*  K 4.3 4.6 3.4* 4.1 3.8  CL 102 105 101 100 94*  CO2 25 22 25 27 22   GLUCOSE 101* 91 108* 106* 110*  BUN 13 11 12 12 13   CREATININE 0.98 0.86 0.85 0.91 0.88  CALCIUM 8.8 8.5 -- -- --  MG -- 2.1 -- 2.2 --  PHOS -- -- -- -- --   Cardiac Enzymes: No results found for this basename: CKTOTAL:3,CKMB:3,CKMBINDEX:3,TROPONINI:3 in the last 72 hours CBC:  Lab 01/04/12 0545 01/03/12 0707  WBC 7.4 9.7  NEUTROABS -- --  HGB 11.9* 12.3*  HCT 35.6* 35.9*  MCV 84.0 84.1  PLT 161 128*   PROTIME:  Basename 01/09/12 0600 01/08/12 0510 01/07/12 1302  LABPROT 19.9* 15.7* 15.0  INR 1.66* 1.22 1.16   Liver Function Tests:     ASSESSMENT AND  PLAN:  Patient Active Hospital Problem List:   Left Main Coronary Artery Disease (01/01/2012)   CABG (01/05/2012)   Bradycardia (01/05/2012)   PVC's (premature ventricular contractions) (01/05/2012)   Atrial fibrillation (01/05/2012)   Hypokalemia (01/05/2012)   Hypothyroidism- TSH 5.1 on 01/05/2012 (01/06/2012)   Continue amio pindolol   Now on coumadin  Plan is home today Will need followyup with PJ in 3-4 weeks Decrease amio>>400 daily in 7 days e  hjave told him to follow his pulse  It may well drigft into the 40s and we may have to stop his pindolol  Signed, Sherryl Manges MD  01/09/2012

## 2012-01-09 NOTE — Progress Notes (Signed)
[id number] Education completed with pt and wife. We are referring to Lance Trujillo, and [facility] for Phase 2. Lance Olvera DunlapRN

## 2012-01-09 NOTE — Progress Notes (Signed)
Discharge instructions along with med list and scripts provided. Follow up appointments and Coumadin clinic appointments discussed; patient and wife verbalized understanding. Escorted to main lobby per Roanna Raider, Charity fundraiser for d/c home with wife. Mamie Levers

## 2012-01-09 NOTE — Progress Notes (Addendum)
9 Days Post-Op Procedure(s) (LRB): CORONARY ARTERY BYPASS GRAFTING (CABG) (N/A)  Subjective: Lance Trujillo has no complaints this morning.  He states he is feeling much better this morning.  Objective: Vital signs in last 24 hours: Temp:  [98.4 F (36.9 C)-98.5 F (36.9 C)] 98.5 F (36.9 C) (08/15 0432) Pulse Rate:  [54-57] 56  (08/15 0432) Cardiac Rhythm:  [-] Sinus bradycardia (08/14 1940) Resp:  [16-20] 16  (08/15 0432) BP: (96-100)/(57-70) 100/70 mmHg (08/15 0432) SpO2:  [95 %-97 %] 96 % (08/15 0432) Weight:  [159 lb 13.3 oz (72.5 kg)] 159 lb 13.3 oz (72.5 kg) (08/15 0404)  Intake/Output from previous day: 08/14 0701 - 08/15 0700 In: 840 [P.O.:840] Out: -   General appearance: alert, cooperative and no distress Heart: regular rate and rhythm Lungs: clear to auscultation bilaterally Abdomen: soft, non-tender; bowel sounds normal; no masses,  no organomegaly Extremities: edema trace Wound: clean and dry  Lab Results: No results found for this basename: WBC:2,HGB:2,HCT:2,PLT:2 in the last 72 hours BMET:  [facility] 01/07/12 0545  NA 136  K 4.3  CL 102  CO2 25  GLUCOSE 101*  BUN 13  CREATININE 0.98  CALCIUM 8.8    PT/INR:  Basename 01/09/12 0600  LABPROT 19.9*  INR 1.66*   ABG    Component Value Date/Time   PHART 7.378 01/01/2012 0121   HCO3 22.7 01/01/2012 0121   TCO2 22 01/01/2012 1659   ACIDBASEDEF 2.0 01/01/2012 0121   O2SAT 99.0 01/01/2012 0121   CBG (last 3)  No results found for this basename: GLUCAP:3 in the last 72 hours  Assessment/Plan: S/P Procedure(s) (LRB): CORONARY ARTERY BYPASS GRAFTING (CABG) (N/A)  1. CV- previous A. Fib, NSR currently on Amiodarone, Coumadin, Pindolol 2. Pulm- no acute issues, continue IS at discharge 3. INR 1.66 will continue 5 mg Coumadin at disharge 4. Dispo- patient doing well, maintaining NSR will d/c home today   LOS: 9 days    BARRETT, ERIN 01/09/2012   Hold sinus home on coumadin check Friday [facility] I have seen and examined Lance Trujillo and agree with the above assessment  and plan.  Delight Ovens MD Beeper [phone] Office [phone] 01/09/2012 9:17 AM

## 2012-01-10 ENCOUNTER — Ambulatory Visit (INDEPENDENT_AMBULATORY_CARE_PROVIDER_SITE_OTHER): Payer: Medicare Other | Admitting: Pharmacist

## 2012-01-10 DIAGNOSIS — I4891 Unspecified atrial fibrillation: Secondary | ICD-10-CM

## 2012-01-10 DIAGNOSIS — Z79899 Other long term (current) drug therapy: Secondary | ICD-10-CM

## 2012-01-10 DIAGNOSIS — Z7901 Long term (current) use of anticoagulants: Secondary | ICD-10-CM

## 2012-01-10 LAB — POCT INR: INR: 4.2

## 2012-01-15 ENCOUNTER — Ambulatory Visit (INDEPENDENT_AMBULATORY_CARE_PROVIDER_SITE_OTHER): Payer: Medicare Other | Admitting: Pharmacist

## 2012-01-15 ENCOUNTER — Telehealth: Payer: Self-pay

## 2012-01-15 ENCOUNTER — Ambulatory Visit
Admission: RE | Admit: 2012-01-15 | Discharge: 2012-01-15 | Disposition: A | Discharge status: Home / Self Care | Payer: Medicare Other | Source: Ambulatory Visit | Attending: Nurse Practitioner | Admitting: Nurse Practitioner

## 2012-01-15 ENCOUNTER — Telehealth: Payer: Self-pay | Admitting: Cardiology

## 2012-01-15 DIAGNOSIS — I2510 Atherosclerotic heart disease of native coronary artery without angina pectoris: Secondary | ICD-10-CM

## 2012-01-15 DIAGNOSIS — Z79899 Other long term (current) drug therapy: Secondary | ICD-10-CM

## 2012-01-15 DIAGNOSIS — R0602 Shortness of breath: Secondary | ICD-10-CM

## 2012-01-15 DIAGNOSIS — Z7901 Long term (current) use of anticoagulants: Secondary | ICD-10-CM

## 2012-01-15 DIAGNOSIS — I4891 Unspecified atrial fibrillation: Secondary | ICD-10-CM

## 2012-01-15 LAB — PROTIME-INR: INR: 6.2 (ref <1.50)

## 2012-01-15 MED ORDER — FUROSEMIDE 40 MG PO TABS: ORAL_TABLET | ORAL | Status: DC

## 2012-01-15 MED ORDER — POTASSIUM CHLORIDE CRYS ER 20 MEQ PO TBCR: EXTENDED_RELEASE_TABLET | ORAL | Status: DC

## 2012-01-15 NOTE — Telephone Encounter (Signed)
Spoke to patient's wife she stated husband woke up several times last night sob.States he did not have any chest pain or fast heart beat.States he feels ok this morning,went for his walk with no sob.Spoke to Norma Fredrickson NP she advised to have cxr this morning.

## 2012-01-15 NOTE — Telephone Encounter (Signed)
Spoke with patient's wife was told to discontinue coumadin,need INR on Monday 01/20/12 Weston Brass will call back this afternoon with a time.Advised to monitor pulse and call back if he thinks he is in atrial fib.Will make Dr.Jordan aware tomorrow 01/16/12.

## 2012-01-15 NOTE — Telephone Encounter (Signed)
Pt woke up last night with SOB, denies any other symptoms, has coumadin appt at 1130a, wants to be seen today, pls call [phone]

## 2012-01-15 NOTE — Telephone Encounter (Signed)
Spoke to Norma Fredrickson NP, CXR revealed effusions.She prescribed Lasix 40 mg daily and KDur 20 meq daily for 7 days.Repeat bmet,cbc on return office visit 01/29/12.  Patient came to office for INR today 01/15/12.INR > 8. INR was drawn and sent to lab. Patient was told to hold coumadin.Weston Brass will call patient this afternoon and let him know final results and  when to have INR repeated.Also EKG was done which revealed sinus bradycardia with rate 48 beats/min.Patient states he has not noticed any atrial fib.Patient advised to call back before appointment if needed.

## 2012-01-15 NOTE — Telephone Encounter (Signed)
Would have him discontinue the coumadin. Recheck an INR on Monday. I would like for him to monitor his pulse for regularity. We will see him with a repeat EKG at his return visit. He needs to call us if he thinks he has recurrent atrial fib.   Please also share with Dr. Swaziland.

## 2012-01-16 ENCOUNTER — Telehealth: Payer: Self-pay

## 2012-01-16 ENCOUNTER — Ambulatory Visit (HOSPITAL_COMMUNITY): Payer: Medicare Other | Attending: Cardiology | Admitting: Radiology

## 2012-01-16 DIAGNOSIS — I4891 Unspecified atrial fibrillation: Secondary | ICD-10-CM | POA: Insufficient documentation

## 2012-01-16 DIAGNOSIS — I059 Rheumatic mitral valve disease, unspecified: Secondary | ICD-10-CM | POA: Insufficient documentation

## 2012-01-16 DIAGNOSIS — I319 Disease of pericardium, unspecified: Secondary | ICD-10-CM | POA: Insufficient documentation

## 2012-01-16 DIAGNOSIS — R0602 Shortness of breath: Secondary | ICD-10-CM

## 2012-01-16 DIAGNOSIS — Z87891 Personal history of nicotine dependence: Secondary | ICD-10-CM | POA: Insufficient documentation

## 2012-01-16 DIAGNOSIS — I079 Rheumatic tricuspid valve disease, unspecified: Secondary | ICD-10-CM | POA: Insufficient documentation

## 2012-01-16 DIAGNOSIS — I2510 Atherosclerotic heart disease of native coronary artery without angina pectoris: Secondary | ICD-10-CM | POA: Insufficient documentation

## 2012-01-16 NOTE — Telephone Encounter (Signed)
Patient called stated he felt better.Stated he had a little sob last night but nothing like the night before.Patient was told Norma Fredrickson NP recommended echo to be done.Echo scheduled today 01/16/12 at 3:00 pm.Arrive at 2:30 pm.

## 2012-01-16 NOTE — Progress Notes (Signed)
Echocardiogram performed.  

## 2012-01-20 ENCOUNTER — Ambulatory Visit (INDEPENDENT_AMBULATORY_CARE_PROVIDER_SITE_OTHER): Payer: Medicare Other | Admitting: Pharmacist

## 2012-01-20 DIAGNOSIS — Z79899 Other long term (current) drug therapy: Secondary | ICD-10-CM

## 2012-01-20 DIAGNOSIS — Z7901 Long term (current) use of anticoagulants: Secondary | ICD-10-CM

## 2012-01-20 DIAGNOSIS — I4891 Unspecified atrial fibrillation: Secondary | ICD-10-CM

## 2012-01-29 ENCOUNTER — Ambulatory Visit (INDEPENDENT_AMBULATORY_CARE_PROVIDER_SITE_OTHER): Payer: Medicare Other | Admitting: Nurse Practitioner

## 2012-01-29 ENCOUNTER — Other Ambulatory Visit: Payer: Medicare Other

## 2012-01-29 ENCOUNTER — Other Ambulatory Visit (INDEPENDENT_AMBULATORY_CARE_PROVIDER_SITE_OTHER): Payer: Medicare Other

## 2012-01-29 ENCOUNTER — Encounter: Payer: Self-pay | Admitting: Nurse Practitioner

## 2012-01-29 VITALS — BP 148/72 | HR 44 | Ht 66.0 in | Wt 154.0 lb

## 2012-01-29 DIAGNOSIS — I2510 Atherosclerotic heart disease of native coronary artery without angina pectoris: Secondary | ICD-10-CM

## 2012-01-29 DIAGNOSIS — Z951 Presence of aortocoronary bypass graft: Secondary | ICD-10-CM

## 2012-01-29 DIAGNOSIS — D649 Anemia, unspecified: Secondary | ICD-10-CM

## 2012-01-29 DIAGNOSIS — R0989 Other specified symptoms and signs involving the circulatory and respiratory systems: Secondary | ICD-10-CM

## 2012-01-29 LAB — CBC WITH DIFFERENTIAL/PLATELET
Basophils Absolute: 0 10*3/uL (ref 0.0–0.1)
Basophils Relative: 0.2 % (ref 0.0–3.0)
Eosinophils Absolute: 0.3 10*3/uL (ref 0.0–0.7)
Eosinophils Relative: 4.9 % (ref 0.0–5.0)
HCT: 40.8 % (ref 39.0–52.0)
Hemoglobin: 13.4 g/dL (ref 13.0–17.0)
Lymphocytes Relative: 13.5 % (ref 12.0–46.0)
Lymphs Abs: 0.9 10*3/uL (ref 0.7–4.0)
MCHC: 32.7 g/dL (ref 30.0–36.0)
MCV: 84.9 fl (ref 78.0–100.0)
Monocytes Absolute: 0.4 10*3/uL (ref 0.1–1.0)
Monocytes Relative: 6.4 % (ref 3.0–12.0)
Neutro Abs: 4.9 10*3/uL (ref 1.4–7.7)
Neutrophils Relative %: 75 % (ref 43.0–77.0)
Platelets: 167 10*3/uL (ref 150.0–400.0)
RBC: 4.81 Mil/uL (ref 4.22–5.81)
RDW: 14.8 % — ABNORMAL HIGH (ref 11.5–14.6)
WBC: 6.6 10*3/uL (ref 4.5–10.5)

## 2012-01-29 MED ORDER — PINDOLOL 5 MG PO TABS: 2.5000 mg | ORAL_TABLET | Freq: Two times a day (BID) | ORAL | Status: DC

## 2012-01-29 NOTE — Progress Notes (Signed)
Lance Trujillo Date of Birth: [birth year] Medical Record #[id number]  History of Present Illness: Lance Trujillo is seen today for a post hospital visit. He is seen for Dr. Swaziland. He has CAD with recent emergent CABG x 5 (LIMA to LAD, SVG to distal 1st OM, SVG to acute marginal, PD and distal LCX) for high grade left main disease. Had post op atrial fib and was on coumadin for a short time. Also on amiodarone. Now on Pindolol in place of Metoprolol.  INR's were markedly supratherapeutic and coumadin has been discontinued. An echo has been done since discharge for dyspnea in the setting of his elevated INR. This was ok. His other problems include prostate cancer, HTN, HLD and chronic bradycardia.   He comes in today. He is here with his wife and son. He is doing well. Has returned home to [facility]. Walking 22 minutes twice a day. Blood pressure is averaging above 140 consistently. Was on HCTZ prior to surgery. Some minimal chest soreness on the left side of his chest. Feels stronger every day.  No fever or chills. Minimal clicking. Anxious to resume driving. Still using some narcotic. Waiting on cardiac rehab in [place]. No atrial fib reported.   Current Outpatient Prescriptions on File Prior to Visit  Medication Sig Dispense Refill  . amiodarone (PACERONE) 400 MG tablet Take 1 tablet (400 mg total) by mouth 2 (two) times daily. For 7 days, then decrease dose to 400 mg once daily  60 tablet  1  . aspirin 81 MG EC tablet Take 1 tablet (81 mg total) by mouth daily. Swallow whole.  30 tablet  12  . atorvastatin (LIPITOR) 10 MG tablet Take 10 mg by mouth daily.      Marland Kitchen omeprazole (PRILOSEC) 20 MG capsule Take 20 mg by mouth daily as needed.      Marland Kitchen OVER THE COUNTER MEDICATION Take 1 tablet by mouth daily. Takes Airborne daily      . pindolol (VISKEN) 5 MG tablet Take 0.5 tablets (2.5 mg total) by mouth 2 (two) times daily.  30 tablet  1    Allergies  Allergen Reactions  . Clindamycin/Lincomycin Rash    Past  Medical History  Diagnosis Date  . Prostate cancer     s/p robotic prostatectomy per Dr. Laverle Patter  . HTN (hypertension)   . HLD (hyperlipidemia)   . Bradycardia   . Atrial fibrillation 01/05/2012    Past Surgical History  Procedure Date  . Prostatectomy   . Cataract extraction   . Coronary artery bypass graft 12/31/2011    Procedure: CORONARY ARTERY BYPASS GRAFTING (CABG);  Surgeon: Delight Ovens, MD;  Location: [facility] OR;  Service: Open Heart Surgery;  Laterality: N/A;  Coronary artery bypass graft times five utilizing the left internal mammary artery and the left greater saphenous vein harvested endoscopically.  Transesophageal Echocardiogram    History  Smoking status  . Former Smoker  . Types: Cigarettes  . Quit date: 12/29/1981  Smokeless tobacco  . Never Used    History  Alcohol Use  . Yes    occasional, formerly heavy intermittent use    No family history on file.  Review of Systems: The review of systems is per the HPI.  All other systems were reviewed and are negative.  Physical Exam: There were no vitals taken for this visit. Patient is very pleasant and in no acute distress. Skin is warm and dry. Color is normal.  HEENT is unremarkable. Normocephalic/atraumatic. PERRL. Sclera are  nonicteric. Neck is supple. No masses. No JVD. Lungs are clear. Cardiac exam shows a regular rate and rhythm. Sternum looks good. Abdomen is soft. Extremities are without edema. Gait and ROM are intact. No gross neurologic deficits noted.  LABORATORY DATA:  EKG today shows marked sinus bradycardia. Rate is 43. Tracing was reviewed with Dr. Swaziland.   Echo Study Conclusions  - Left ventricle: The cavity size was normal. Wall thickness was normal. Systolic function was normal. The estimated ejection fraction was in the range of 55% to 60%. Wall motion was normal; there were no regional wall motion abnormalities. Features are consistent with a pseudonormal left ventricular filling  pattern, with concomitant abnormal relaxation and increased filling pressure (grade 2 diastolic dysfunction). - Mitral valve: Calcified annulus. Mild regurgitation. - Left atrium: The atrium was mildly dilated. - Pulmonary arteries: PA peak pressure: 35mm Hg (S). - Pericardium, extracardiac: A trivial pericardial effusion was identified. There was a left pleural effusion.    Assessment / Plan:  1. CAD with recent emergent CABG x 5 - He is doing very well. Will check follow up labs today. Reviewed lifting restrictions. Ok to drive when no longer taking narcotics. Ok to travel to [facility] next month. We will see him back in 6 weeks. Sees Dr. Tyrone Sage tomorrow.   2. Post op atrial fib - on amiodarone. His rate remains slow. Have discussed with Dr. Swaziland. Will go ahead and stop the amiodarone. Will continue the low dose beta blocker.   3. Blood loss anemia - rechecking labs today  4. HTN - BP is up some. I have restarted his HCTZ but at half dose. He is seeing his PCP later this week.   5. HLD - on statin therapy.  Overall, he is felt to be doing very well. Will see him back in about 6 weeks. Have asked him to continue to monitor his blood pressure at home. Patient is agreeable to this plan and will call if any problems develop in the interim.

## 2012-01-29 NOTE — Addendum Note (Signed)
Addended by: Dossie Arbour on: 01/29/2012 02:26 PM   Modules accepted: Orders

## 2012-01-29 NOTE — Patient Instructions (Addendum)
We will check some labs today  Stop your amiodarone  Dr. Swaziland will see you in 6 weeks.  Walk daily  Stay on your other medicines.   Restart your HCTZ at just half dose (25 mg) daily  Call the [facility] office at [phone] if you have any questions, problems or concerns.

## 2012-01-30 ENCOUNTER — Ambulatory Visit
Admission: RE | Admit: 2012-01-30 | Discharge: 2012-01-30 | Disposition: A | Discharge status: Home / Self Care | Payer: Medicare Other | Source: Ambulatory Visit | Attending: Cardiothoracic Surgery | Admitting: Cardiothoracic Surgery

## 2012-01-30 ENCOUNTER — Encounter: Payer: Self-pay | Admitting: Cardiothoracic Surgery

## 2012-01-30 ENCOUNTER — Other Ambulatory Visit: Payer: Self-pay | Admitting: Cardiothoracic Surgery

## 2012-01-30 ENCOUNTER — Ambulatory Visit (INDEPENDENT_AMBULATORY_CARE_PROVIDER_SITE_OTHER): Payer: Self-pay | Admitting: Cardiothoracic Surgery

## 2012-01-30 VITALS — BP 144/80 | HR 47 | Resp 18 | Ht 66.0 in | Wt 154.0 lb

## 2012-01-30 DIAGNOSIS — I2510 Atherosclerotic heart disease of native coronary artery without angina pectoris: Secondary | ICD-10-CM

## 2012-01-30 DIAGNOSIS — Z951 Presence of aortocoronary bypass graft: Secondary | ICD-10-CM

## 2012-01-30 MED ORDER — HYDROCODONE-ACETAMINOPHEN 5-500 MG PO TABS: 1.0000 | ORAL_TABLET | Freq: Four times a day (QID) | ORAL | Status: AC | PRN

## 2012-01-30 NOTE — Patient Instructions (Signed)
Doing well No lifting over 25 lbs for 3 months Start cardiac rhab May drive

## 2012-01-30 NOTE — Progress Notes (Signed)
[address]            [place] [address]          [phone]       Lance Trujillo [facility] Medical Record #[id number] Date of Birth: February 07, 1936  Swaziland, [name], MD Pcp Not In System  Chief Complaint:   PostOp Follow Up Visit    Emergency Surgery 12/31/2011   PREOPERATIVE DIAGNOSIS: Progressive anginal symptoms with critical left  main disease.  POSTOPERATIVE DIAGNOSIS: Progressive anginal symptoms with critical  left main disease.  SURGICAL PROCEDURE: Coronary artery bypass grafting x5 left internal  mammary to the left anterior descending coronary artery, reverse  saphenous vein graft to the distal first obtuse marginal, triple  sequential reverse saphenous vein, vein graft to the acute marginal  posterior descending, and distal circumflex with endo vein harvesting  from the left thigh and calf.   History of Present Illness:      He has CAD with recent emergent CABG x 5 (LIMA to LAD, SVG to distal 1st OM, SVG to acute marginal, PD and distal LCX) for high grade left main disease. Had post op atrial fib and was on coumadin for a short time. Also on amiodarone. Now on Pindolol in place of Metoprolol. INR's were markedly supratherapeutic and coumadin has been discontinued. An echo has been done since discharge for dyspnea in the setting of his elevated INR. Good lv function and no effusion.    He is doing well. Has returned home to [facility]. Walking 22 minutes twice a day. Blood pressure is averaging above 140 consistently. Was on HCTZ prior to surgery. Some minimal chest soreness on the left side of his chest. Feels stronger every day. No fever or chills. Minimal clicking. Anxious to resume driving.    Waiting on cardiac rehab in [place]. No atrial fib reported.           History  Smoking status  . Former Smoker  . Types: Cigarettes  . Quit date: 12/29/1981  Smokeless tobacco  . Never Used       Allergies  Allergen  Reactions  . Clindamycin/Lincomycin Rash    Current Outpatient Prescriptions  Medication Sig Dispense Refill  . aspirin 81 MG EC tablet Take 1 tablet (81 mg total) by mouth daily. Swallow whole.  30 tablet  12  . atorvastatin (LIPITOR) 10 MG tablet Take 10 mg by mouth daily.      Marland Kitchen docusate sodium (COLACE) 100 MG capsule Take 100 mg by mouth daily.      . hydrochlorothiazide (HYDRODIURIL) 25 MG tablet Take 25 mg by mouth daily. 1/2 tab po every day      . omeprazole (PRILOSEC) 20 MG capsule Take 20 mg by mouth daily as needed.      Marland Kitchen OVER THE COUNTER MEDICATION Take 1 tablet by mouth daily. Takes Airborne daily      . oxyCODONE (OXY IR/ROXICODONE) 5 MG immediate release tablet Take 2.5 mg by mouth every 4 (four) hours as needed.      . pindolol (VISKEN) 5 MG tablet Take 0.5 tablets (2.5 mg total) by mouth 2 (two) times daily.  30 tablet  6       Physical Exam: BP 144/80  Pulse 47  Resp 18  Ht 5\' 6"  (1.676 Trujillo)  Wt 154 lb (69.854 kg)  BMI 24.86 kg/m2  SpO2 99%  General appearance: alert, cooperative and no distress Neurologic: intact Heart: regular rate and rhythm, S1, S2 normal, no murmur, click, rub or gallop and normal apical impulse Lungs: clear to auscultation bilaterally and normal percussion bilaterally Abdomen: soft, non-tender; bowel sounds normal; no masses,  no organomegaly Extremities: extremities normal, atraumatic, no cyanosis or edema, Homans sign is negative, no sign of DVT and wounds well healed. Wound: sternum stable and well healed   Diagnostic Studies & Laboratory data:         Recent Radiology Findings: Dg Chest 2 View  01/30/2012  *RADIOLOGY REPORT*  Clinical Data: Follow up CABG  CHEST - 2 VIEW  Comparison: 01/15/2012  Findings: Postop change from median sternotomy CABG procedure.  There are small bilateral pleural effusions.  These are slightly improved from previous exam.  No interstitial edema or airspace consolidation.  IMPRESSION:  1.  Slight  improvement and bilateral pleural effusions.   Original Report Authenticated By: Rosealee Albee, Trujillo.D.       Recent Labs: Lab Results  Component Value Date   WBC 7.4 01/04/2012   HGB 11.9* 01/04/2012   HCT 35.6* 01/04/2012   PLT 161 01/04/2012   GLUCOSE 101* 01/07/2012   CHOL 154 12/30/2011   TRIG 114.0 12/30/2011   HDL 45.00 12/30/2011   LDLCALC 86 12/30/2011   ALT 32 12/30/2011   AST 28 12/30/2011   NA 136 01/07/2012   K 4.3 01/07/2012   CL 102 01/07/2012   CREATININE 0.98 01/07/2012   BUN 13 01/07/2012   CO2 25 01/07/2012   TSH 5.136* 01/03/2012   INR 3.2 01/20/2012   HGBA1C 5.7* 01/02/2012      Assessment / Plan:   Doing well post op, no heart failure or angina Encouraged to go to Cardiac Rehab Will see back prn      Delight Ovens MD 01/30/2012 10:29 AM

## 2012-01-30 NOTE — Addendum Note (Signed)
Addended by: Sheliah Plane B on: 01/30/2012 10:54 AM   Modules accepted: Orders

## 2012-02-03 ENCOUNTER — Encounter: Payer: Self-pay | Admitting: Nurse Practitioner

## 2012-02-05 ENCOUNTER — Telehealth: Payer: Self-pay | Admitting: *Deleted

## 2012-02-05 NOTE — Telephone Encounter (Signed)
Message copied by Awilda Bill on Wed Feb 05, 2012 10:06 AM ------      Message from: Lance Trujillo      Created: Mon Feb 03, 2012  7:38 AM       Ok to report. CBC is satisfactory. Did he not get a BMET??

## 2012-02-05 NOTE — Telephone Encounter (Signed)
Called pt regarding lab results.  Pt answering machine not set up, no answer.  Will try again.   ADB

## 2012-02-13 ENCOUNTER — Encounter: Payer: Self-pay | Admitting: Cardiology

## 2012-02-27 ENCOUNTER — Other Ambulatory Visit: Payer: Self-pay | Admitting: *Deleted

## 2012-02-27 DIAGNOSIS — G8918 Other acute postprocedural pain: Secondary | ICD-10-CM

## 2012-02-27 MED ORDER — HYDROCODONE-ACETAMINOPHEN 5-500 MG PO TABS: 1.0000 | ORAL_TABLET | Freq: Four times a day (QID) | ORAL | Status: AC | PRN

## 2012-03-10 ENCOUNTER — Ambulatory Visit (INDEPENDENT_AMBULATORY_CARE_PROVIDER_SITE_OTHER): Payer: Medicare Other | Admitting: Cardiology

## 2012-03-10 ENCOUNTER — Encounter: Payer: Self-pay | Admitting: Cardiology

## 2012-03-10 VITALS — BP 128/80 | HR 47 | Ht 66.0 in | Wt 162.8 lb

## 2012-03-10 DIAGNOSIS — I2510 Atherosclerotic heart disease of native coronary artery without angina pectoris: Secondary | ICD-10-CM

## 2012-03-10 DIAGNOSIS — I4949 Other premature depolarization: Secondary | ICD-10-CM

## 2012-03-10 DIAGNOSIS — I493 Ventricular premature depolarization: Secondary | ICD-10-CM

## 2012-03-10 DIAGNOSIS — Z951 Presence of aortocoronary bypass graft: Secondary | ICD-10-CM

## 2012-03-10 DIAGNOSIS — R001 Bradycardia, unspecified: Secondary | ICD-10-CM

## 2012-03-10 DIAGNOSIS — I498 Other specified cardiac arrhythmias: Secondary | ICD-10-CM

## 2012-03-10 NOTE — Patient Instructions (Signed)
Continue your current medication.  I will see you again in 6 months.   

## 2012-03-10 NOTE — Progress Notes (Signed)
Lance Trujillo Date of Birth: 1935-10-25 Medical Record #[id number]  History of Present Illness: Lance Trujillo is seen today for a followup visit. He has CAD with recent emergent CABG x 5 on 12/31/2011 (LIMA to LAD, SVG to distal 1st OM, SVG to acute marginal, PD and distal LCX) for high grade left main disease. Had post op atrial fib and was on coumadin for a short time.  Amiodarone was discontinued on his last visit. Now on Pindolol in place of Metoprolol.   His other problems include prostate cancer, HTN, HLD and chronic bradycardia.  On followup today he reports he is doing well. He still has some incisional discomfort at times. Sometimes this is a dull left parasternal ache and other times he has sharp needlelike pains. He has been exercising daily walking up to 3-4 miles per day. He really notes no limitations now. He denies any recurrent palpitations. His pulse rate has been slow at 48 beats per minute but he feels well with this.  Current Outpatient Prescriptions on File Prior to Visit  Medication Sig Dispense Refill  . aspirin 81 MG EC tablet Take 1 tablet (81 mg total) by mouth daily. Swallow whole.  30 tablet  12  . atorvastatin (LIPITOR) 10 MG tablet Take 10 mg by mouth daily.      Marland Kitchen docusate sodium (COLACE) 100 MG capsule Take 100 mg by mouth daily.      . hydrochlorothiazide (HYDRODIURIL) 25 MG tablet Take 25 mg by mouth daily.       Marland Kitchen HYDROcodone-acetaminophen (VICODIN) 5-500 MG per tablet Take 1 tablet by mouth every 6 (six) hours as needed for pain.  30 tablet  0  . omeprazole (PRILOSEC) 20 MG capsule Take 20 mg by mouth daily as needed.      Marland Kitchen OVER THE COUNTER MEDICATION Take 1 tablet by mouth daily. Takes Airborne daily      . pindolol (VISKEN) 5 MG tablet Take 0.5 tablets (2.5 mg total) by mouth 2 (two) times daily.  30 tablet  6    Allergies  Allergen Reactions  . Clindamycin/Lincomycin Rash    Past Medical History  Diagnosis Date  . Prostate cancer     s/p robotic  prostatectomy per Dr. Laverle Patter  . HTN (hypertension)   . HLD (hyperlipidemia)   . Bradycardia   . Atrial fibrillation 01/05/2012  . CAD (coronary artery disease) 12/31/2011    s/p emergent CABG x 5 for high grade left main disease    Past Surgical History  Procedure Date  . Prostatectomy   . Cataract extraction   . Coronary artery bypass graft 12/31/2011    Procedure: CORONARY ARTERY BYPASS GRAFTING (CABG);  Surgeon: Delight Ovens, MD;  Location: [facility] OR;  Service: Open Heart Surgery;  Laterality: N/A;  Coronary artery bypass graft times five utilizing the left internal mammary artery and the left greater saphenous vein harvested endoscopically.  Transesophageal Echocardiogram    History  Smoking status  . Former Smoker  . Types: Cigarettes  . Quit date: 12/29/1981  Smokeless tobacco  . Never Used    History  Alcohol Use  . Yes    occasional, formerly heavy intermittent use    History reviewed. No pertinent family history.  Review of Systems: The review of systems is per the HPI.  All other systems were reviewed and are negative.  Physical Exam: BP 128/80  Pulse 47  Ht 5\' 6"  (1.676 m)  Wt 73.846 kg (162 lb 12.8 oz)  BMI 26.28 kg/m2  SpO2 98% Patient is very pleasant and in no acute distress. Skin is warm and dry. Color is normal.  HEENT is unremarkable. Normocephalic/atraumatic. PERRL. Sclera are nonicteric. Neck is supple. No masses. No JVD. Lungs are clear. Cardiac exam shows a regular rate and rhythm. Sternum looks good. Abdomen is soft. Extremities are without edema. Gait and ROM are intact. No gross neurologic deficits noted.  LABORATORY DATA:    Assessment / Plan:  1. CAD STATUS post emergent CABG x 5 - He is doing very well. He really has no restrictions at this time. We will continue with long-term aspirin, statin, and beta blocker therapy. I will plan on seeing him back again in 6 months.  2. Post op atrial fib - no recurrence off of amiodarone. He does have  sinus bradycardia but is asymptomatic. If he should become symptomatic we would need to stop his pindolol but currently he is on a very low dose.  3.  HTN - BP is satisfactory.  4. HLD - on statin therapy.

## 2012-03-18 ENCOUNTER — Ambulatory Visit: Payer: Medicare Other | Admitting: Cardiology

## 2012-07-15 ENCOUNTER — Telehealth: Payer: Self-pay | Admitting: Cardiology

## 2012-07-15 NOTE — Telephone Encounter (Signed)
ROI Mailed to Pt Home Address. 07/15/12/KM

## 2012-07-22 ENCOUNTER — Telehealth: Payer: Self-pay | Admitting: Cardiology

## 2012-07-22 NOTE — Telephone Encounter (Signed)
Pt Called said He would be In [place] Monday and Wanted To Pick up Just His OV Notes, I have Printed these Out and he will sign ROI Upon Picking These Up 07/22/12/KM

## 2012-11-10 ENCOUNTER — Other Ambulatory Visit: Payer: Self-pay | Admitting: *Deleted

## 2012-11-10 DIAGNOSIS — D649 Anemia, unspecified: Secondary | ICD-10-CM

## 2012-11-10 DIAGNOSIS — Z951 Presence of aortocoronary bypass graft: Secondary | ICD-10-CM

## 2012-11-10 MED ORDER — PINDOLOL 5 MG PO TABS: 2.5000 mg | ORAL_TABLET | Freq: Two times a day (BID) | ORAL | Status: AC

## 2013-04-09 IMAGING — CR DG CHEST 2V
2 series · 2 of 2 positions shown · non-contrast
Comparison: 01/15/2012

CLINICAL DATA: Follow up CABG

CHEST - 2 VIEW

[w chest pa]
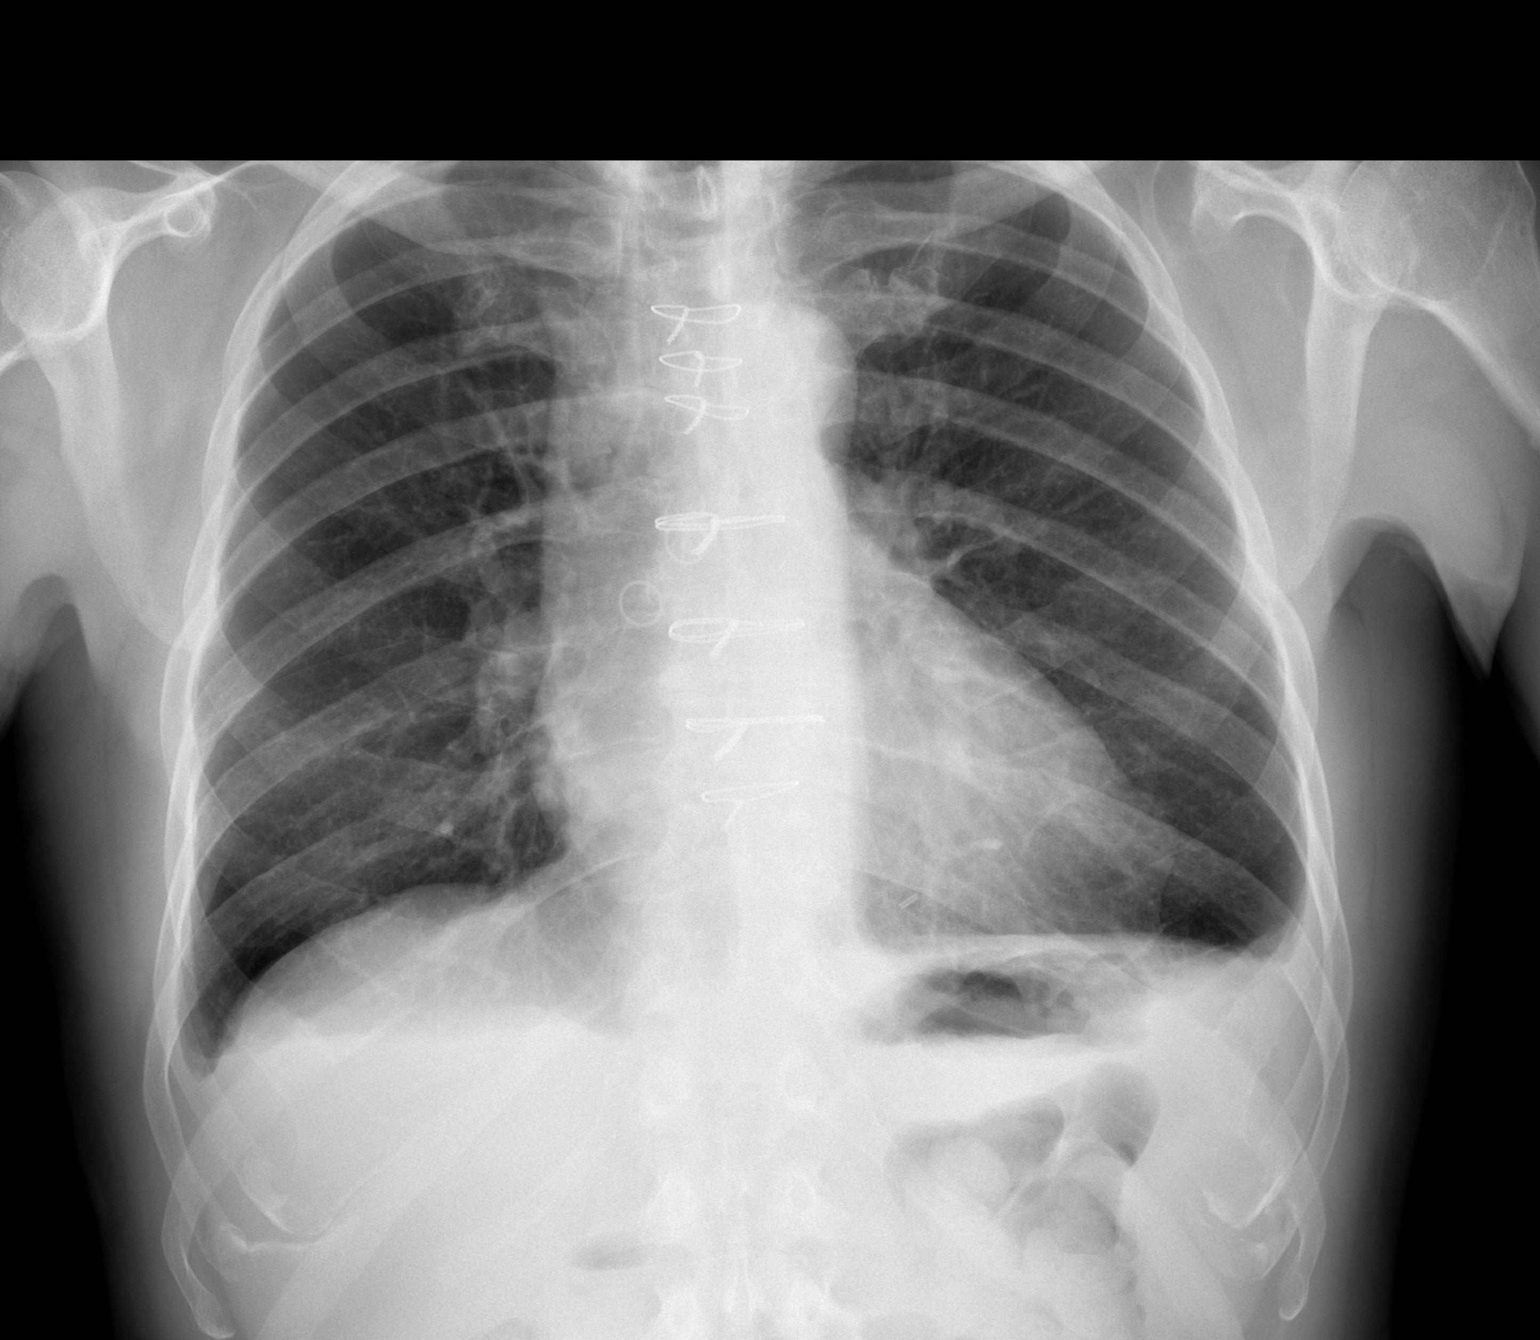

[w chest lat]
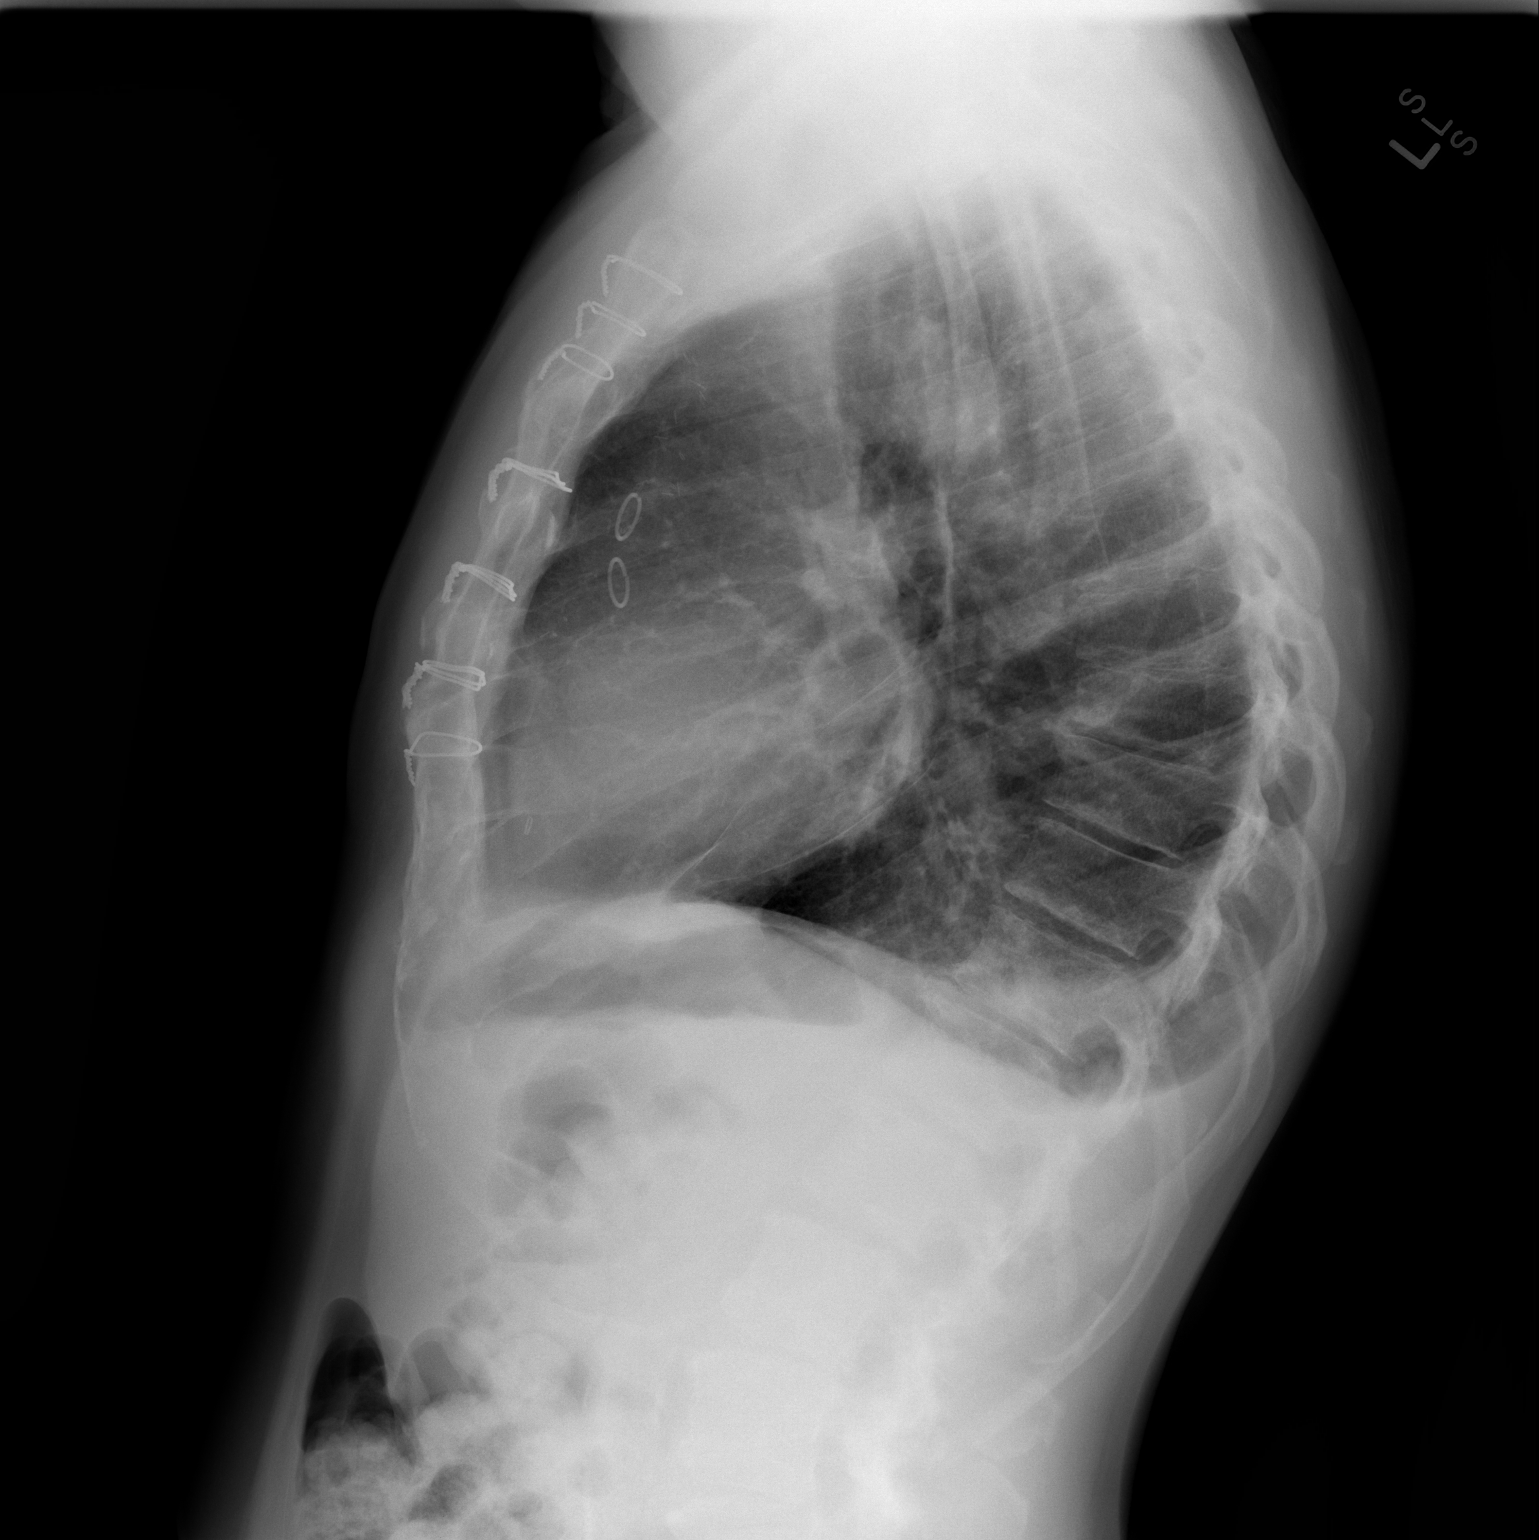

[2 of 2 positions shown; findings below may reference images not displayed]

FINDINGS: Postop change from median sternotomy CABG procedure.

There are small bilateral pleural effusions.  These are slightly
improved from previous exam.  No interstitial edema or airspace
consolidation.
IMPRESSION: 1.  Slight improvement and bilateral pleural effusions.

## 2013-12-15 ENCOUNTER — Ambulatory Visit: Payer: Medicare Other | Admitting: Physician Assistant

## 2014-04-27 ENCOUNTER — Ambulatory Visit (INDEPENDENT_AMBULATORY_CARE_PROVIDER_SITE_OTHER): Payer: Self-pay | Admitting: Surgery

## 2014-05-05 ENCOUNTER — Encounter (HOSPITAL_COMMUNITY): Payer: Self-pay | Admitting: Cardiology

## 2014-05-30 ENCOUNTER — Encounter (HOSPITAL_COMMUNITY): Payer: Self-pay | Admitting: *Deleted

## 2014-06-01 ENCOUNTER — Other Ambulatory Visit (HOSPITAL_COMMUNITY): Payer: Self-pay | Admitting: *Deleted

## 2014-06-01 ENCOUNTER — Encounter (HOSPITAL_COMMUNITY): Payer: Self-pay | Admitting: *Deleted

## 2014-06-01 DIAGNOSIS — K4090 Unilateral inguinal hernia, without obstruction or gangrene, not specified as recurrent: Secondary | ICD-10-CM

## 2014-06-01 NOTE — H&P (Signed)
General Surgery [facility]  Sammuel Hines. Venezia DOB: Jul 29, 1935 Married / Language: English / Race: White Male  History of Present Illness Patient words: possible hernia.  The patient is a 79 year old male who presents with an inguinal hernia. Patient is referred by his son, Dr. Autumn Messing, for evaluation of right inguinal hernia. Patient first developed symptoms approximately 6 weeks ago. This presented initially as right testicular pain. It would last between 10 and 15 minutes and then resolved. It was more significant with physical activity. 2 weeks ago while raking leaves the patient noted pain in the inguinal region and detected a palpable bulge. This was reducible. He denies any signs or symptoms of obstruction. Patient has had no prior hernia repairs.  Previous abdominal surgery includes robotic prostatectomy for carcinoma in 2010. He has had no sign of recurrence.   Other Problems Anxiety Disorder Atrial Fibrillation High blood pressure Hypercholesterolemia Prostate Cancer Transfusion history  Past Surgical History Cataract Surgery Bilateral. Coronary Artery Bypass Graft Prostate Surgery - Removal  Allergies  Clindamycin HCl *ANTI-INFECTIVE AGENTS - MISC.* Zetia *ANTIHYPERLIPIDEMICS* Lipitor *ANTIHYPERLIPIDEMICS*  Medication History  Omeprazole (20MG  Capsule DR, Oral) Active. Mometasone Furoate (0.1% Ointment, External) Active. Hydrochlorothiazide (25MG  Tablet, Oral) Active. Baby Aspirin (81MG  Tablet Chewable, Oral) Active. Co Q10 (200MG  Capsule, Oral) Active. Colesevelam HCl (625MG  Tablet, Oral) Active. Folic Acid (341PFX Tablet, Oral) Active. Lysine HCl (500MG  Capsule, Oral) Active.  Social History Alcohol use Remotely quit alcohol use. Caffeine use Carbonated beverages. No drug use Tobacco use Former smoker.  Family History Alcohol Abuse Father. Arthritis Mother. Heart Disease Father, Mother. Heart  disease in male family member before age 49 Heart disease in male family member before age 26 Hypertension Mother, Sister. Kidney Disease Sister.  Review of Systems General Present- Weight Loss. Not Present- Appetite Loss, Chills, Fatigue, Fever, Night Sweats and Weight Gain. Skin Present- Dryness. Not Present- Change in Wart/Mole, Hives, Jaundice, New Lesions, Non-Healing Wounds, Rash and Ulcer. HEENT Present- Hoarseness, Sore Throat and Wears glasses/contact lenses. Not Present- Earache, Hearing Loss, Nose Bleed, Oral Ulcers, Ringing in the Ears, Seasonal Allergies, Sinus Pain, Visual Disturbances and Yellow Eyes. Respiratory Present- Snoring. Not Present- Bloody sputum, Chronic Cough, Difficulty Breathing and Wheezing. Breast Not Present- Breast Mass, Breast Pain, Nipple Discharge and Skin Changes. Cardiovascular Present- Palpitations. Not Present- Chest Pain, Difficulty Breathing Lying Down, Leg Cramps, Rapid Heart Rate, Shortness of Breath and Swelling of Extremities. Gastrointestinal Present- Abdominal Pain and Excessive gas. Not Present- Bloating, Bloody Stool, Change in Bowel Habits, Chronic diarrhea, Constipation, Difficulty Swallowing, Gets full quickly at meals, Hemorrhoids, Indigestion, Nausea, Rectal Pain and Vomiting. Male Genitourinary Present- Frequency and Urine Leakage. Not Present- Blood in Urine, Change in Urinary Stream, Impotence, Nocturia, Painful Urination and Urgency. Musculoskeletal Present- Joint Pain and Joint Stiffness. Not Present- Back Pain, Muscle Pain, Muscle Weakness and Swelling of Extremities. Neurological Present- Tingling. Not Present- Decreased Memory, Fainting, Headaches, Numbness, Seizures, Tremor, Trouble walking and Weakness. Psychiatric Present- Anxiety. Not Present- Bipolar, Change in Sleep Pattern, Depression, Fearful and Frequent crying. Endocrine Not Present- Cold Intolerance, Excessive Hunger, Hair Changes, Heat Intolerance, Hot flashes and New  Diabetes. Hematology Not Present- Easy Bruising, Excessive bleeding, Gland problems, HIV and Persistent Infections.   Vitals 04/27/2014 12:16 PM Weight: 160 lb Height: 66in Body Surface Area: 1.84 m Body Mass Index: 25.82 kg/m Temp.: 96.74F  Pulse: 49 (Regular)  BP: 130/60 (Sitting, Left Arm, Standard)    Physical Exam   General - appears comfortable, no distress; not  diaphorectic  HEENT - normocephalic; sclerae clear, gaze conjugate; mucous membranes moist, dentition good; voice normal  Neck - symmetric on extension; no palpable anterior or posterior cervical adenopathy; no palpable masses in the thyroid bed  Chest - clear bilaterally with rhonchi, rales, or wheeze  Cor - regular rhythm with slow rate; no significant murmur  Abd - soft without distension  GU - normal male genitalia without mass or lesion; obvious visible bulge in right groin; palpation in the right inguinal canal with cough and Valsalva shows a reducible right inguinal hernia with mild tenderness; left inguinal canal shows no evidence of inguinal hernia with cough and Valsalva; palpation of the testicles shows no sign of epididymitis  Ext - non-tender without significant edema or lymphedema  Neuro - grossly intact; no tremor    Assessment & Plan  INGUINAL HERNIA, RIGHT (550.90  K40.90)  The patient has a reducible right inguinal hernia which is symptomatic. He has had no signs or symptoms of obstruction. It has always been reducible. He has had no prior hernia repairs.  I have recommended open right inguinal hernia repair with mesh. We discussed the procedure at length. We discussed the use of prosthetic mesh. We discussed restrictions on his activities following the procedure. We discussed the risk of recurrence being approximately 2%. Patient understands and wishes to proceed with surgery in the near future. We will arrange for an outpatient surgical procedure at a time convenient for the  patient.  The risks and benefits of the procedure have been discussed at length with the patient. The patient understands the proposed procedure, potential alternative treatments, and the course of recovery to be expected. All of the patient's questions have been answered at this time. The patient wishes to proceed with surgery.  Earnstine Regal, MD, [facility] Office: [phone]

## 2014-06-02 ENCOUNTER — Ambulatory Visit (HOSPITAL_COMMUNITY): Payer: Medicare Other

## 2014-06-02 ENCOUNTER — Encounter (HOSPITAL_COMMUNITY): Payer: Self-pay | Admitting: *Deleted

## 2014-06-02 ENCOUNTER — Ambulatory Visit (HOSPITAL_COMMUNITY): Payer: Medicare Other | Admitting: Anesthesiology

## 2014-06-02 ENCOUNTER — Encounter (HOSPITAL_COMMUNITY)
Admission: RE | Disposition: A | Discharge status: Home / Self Care | Payer: Self-pay | Source: Ambulatory Visit | Attending: Surgery

## 2014-06-02 ENCOUNTER — Ambulatory Visit (HOSPITAL_COMMUNITY)
Admission: RE | Admit: 2014-06-02 | Discharge: 2014-06-02 | Disposition: A | Discharge status: Home / Self Care | Payer: Medicare Other | Source: Ambulatory Visit | Attending: Surgery | Admitting: Surgery

## 2014-06-02 DIAGNOSIS — Z951 Presence of aortocoronary bypass graft: Secondary | ICD-10-CM | POA: Diagnosis not present

## 2014-06-02 DIAGNOSIS — I2510 Atherosclerotic heart disease of native coronary artery without angina pectoris: Secondary | ICD-10-CM | POA: Insufficient documentation

## 2014-06-02 DIAGNOSIS — Z8546 Personal history of malignant neoplasm of prostate: Secondary | ICD-10-CM | POA: Insufficient documentation

## 2014-06-02 DIAGNOSIS — E039 Hypothyroidism, unspecified: Secondary | ICD-10-CM | POA: Diagnosis not present

## 2014-06-02 DIAGNOSIS — Z79899 Other long term (current) drug therapy: Secondary | ICD-10-CM | POA: Diagnosis not present

## 2014-06-02 DIAGNOSIS — Z87891 Personal history of nicotine dependence: Secondary | ICD-10-CM | POA: Diagnosis not present

## 2014-06-02 DIAGNOSIS — K4090 Unilateral inguinal hernia, without obstruction or gangrene, not specified as recurrent: Secondary | ICD-10-CM

## 2014-06-02 DIAGNOSIS — I10 Essential (primary) hypertension: Secondary | ICD-10-CM | POA: Diagnosis not present

## 2014-06-02 DIAGNOSIS — K219 Gastro-esophageal reflux disease without esophagitis: Secondary | ICD-10-CM | POA: Diagnosis not present

## 2014-06-02 DIAGNOSIS — E780 Pure hypercholesterolemia: Secondary | ICD-10-CM | POA: Diagnosis not present

## 2014-06-02 DIAGNOSIS — I4891 Unspecified atrial fibrillation: Secondary | ICD-10-CM | POA: Insufficient documentation

## 2014-06-02 HISTORY — DX: Other complications of anesthesia, initial encounter: T88.59XA

## 2014-06-02 HISTORY — DX: Adverse effect of unspecified anesthetic, initial encounter: T41.45XA

## 2014-06-02 HISTORY — DX: Cardiac arrhythmia, unspecified: I49.9

## 2014-06-02 HISTORY — DX: Unspecified urinary incontinence: R32

## 2014-06-02 HISTORY — DX: Unilateral inguinal hernia, without obstruction or gangrene, not specified as recurrent: K40.90

## 2014-06-02 HISTORY — DX: Gastro-esophageal reflux disease without esophagitis: K21.9

## 2014-06-02 HISTORY — DX: Personal history of other infectious and parasitic diseases: Z86.19

## 2014-06-02 HISTORY — PX: INSERTION OF MESH: SHX5868

## 2014-06-02 HISTORY — PX: INGUINAL HERNIA REPAIR: SHX194

## 2014-06-02 LAB — CBC
HEMATOCRIT: 50.5 % (ref 39.0–52.0)
HEMOGLOBIN: 16.9 g/dL (ref 13.0–17.0)
MCH: 28.5 pg (ref 26.0–34.0)
MCHC: 33.5 g/dL (ref 30.0–36.0)
MCV: 85.3 fL (ref 78.0–100.0)
PLATELETS: 161 10*3/uL (ref 150–400)
RBC: 5.92 MIL/uL — ABNORMAL HIGH (ref 4.22–5.81)
RDW: 13 % (ref 11.5–15.5)
WBC: 4.9 10*3/uL (ref 4.0–10.5)

## 2014-06-02 LAB — BASIC METABOLIC PANEL
Anion gap: 11 (ref 5–15)
BUN: 18 mg/dL (ref 6–23)
CALCIUM: 9.3 mg/dL (ref 8.4–10.5)
CO2: 26 mmol/L (ref 19–32)
CREATININE: 1.13 mg/dL (ref 0.50–1.35)
Chloride: 95 mEq/L — ABNORMAL LOW (ref 96–112)
GFR calc Af Amer: 71 mL/min — ABNORMAL LOW (ref >90)
GFR, EST NON AFRICAN AMERICAN: 61 mL/min — AB (ref >90)
GLUCOSE: 99 mg/dL (ref 70–99)
Potassium: 3.8 mmol/L (ref 3.5–5.1)
Sodium: 132 mmol/L — ABNORMAL LOW (ref 135–145)

## 2014-06-02 SURGERY — REPAIR, HERNIA, INGUINAL, ADULT
Anesthesia: General | Laterality: Right

## 2014-06-02 MED ORDER — EPHEDRINE SULFATE 50 MG/ML IJ SOLN
INTRAMUSCULAR | Status: AC
  Filled 2014-06-02: qty 1

## 2014-06-02 MED ORDER — EPHEDRINE SULFATE 50 MG/ML IJ SOLN
INTRAMUSCULAR | Status: DC | PRN
  Administered 2014-06-02 (×5): 5 mg via INTRAVENOUS

## 2014-06-02 MED ORDER — LIDOCAINE HCL (CARDIAC) 20 MG/ML IV SOLN
INTRAVENOUS | Status: DC | PRN
  Administered 2014-06-02: 100 mg via INTRAVENOUS

## 2014-06-02 MED ORDER — SUCCINYLCHOLINE CHLORIDE 20 MG/ML IJ SOLN
INTRAMUSCULAR | Status: DC | PRN
  Administered 2014-06-02: 100 mg via INTRAVENOUS

## 2014-06-02 MED ORDER — ONDANSETRON HCL 4 MG/2ML IJ SOLN: 4.0000 mg | Freq: Once | INTRAMUSCULAR | Status: DC | PRN

## 2014-06-02 MED ORDER — NEOSTIGMINE METHYLSULFATE 10 MG/10ML IV SOLN
INTRAVENOUS | Status: AC
  Filled 2014-06-02: qty 1

## 2014-06-02 MED ORDER — FENTANYL CITRATE 0.05 MG/ML IJ SOLN
INTRAMUSCULAR | Status: AC
  Filled 2014-06-02: qty 5

## 2014-06-02 MED ORDER — SODIUM CHLORIDE 0.9 % IJ SOLN
INTRAMUSCULAR | Status: AC
  Filled 2014-06-02: qty 10

## 2014-06-02 MED ORDER — GLYCOPYRROLATE 0.2 MG/ML IJ SOLN
INTRAMUSCULAR | Status: DC | PRN
  Administered 2014-06-02: 0.2 mg via INTRAVENOUS
  Administered 2014-06-02: 0.4 mg via INTRAVENOUS

## 2014-06-02 MED ORDER — ROCURONIUM BROMIDE 100 MG/10ML IV SOLN
INTRAVENOUS | Status: DC | PRN
  Administered 2014-06-02: 40 mg via INTRAVENOUS

## 2014-06-02 MED ORDER — FENTANYL CITRATE 0.05 MG/ML IJ SOLN: 25.0000 ug | INTRAMUSCULAR | Status: DC | PRN

## 2014-06-02 MED ORDER — OXYCODONE HCL 5 MG PO TABS
5.0000 mg | ORAL_TABLET | Freq: Four times a day (QID) | ORAL | Status: DC | PRN
  Administered 2014-06-02: 5 mg via ORAL
  Filled 2014-06-02: qty 1

## 2014-06-02 MED ORDER — ROCURONIUM BROMIDE 100 MG/10ML IV SOLN
INTRAVENOUS | Status: AC
  Filled 2014-06-02: qty 1

## 2014-06-02 MED ORDER — DEXAMETHASONE SODIUM PHOSPHATE 10 MG/ML IJ SOLN
INTRAMUSCULAR | Status: AC
  Filled 2014-06-02: qty 1

## 2014-06-02 MED ORDER — ONDANSETRON HCL 4 MG/2ML IJ SOLN
INTRAMUSCULAR | Status: DC | PRN
  Administered 2014-06-02: 4 mg via INTRAVENOUS

## 2014-06-02 MED ORDER — ACETAMINOPHEN 10 MG/ML IV SOLN
1000.0000 mg | INTRAVENOUS | Status: AC
  Administered 2014-06-02: 1000 mg via INTRAVENOUS
  Filled 2014-06-02: qty 100

## 2014-06-02 MED ORDER — NEOSTIGMINE METHYLSULFATE 10 MG/10ML IV SOLN
INTRAVENOUS | Status: DC | PRN
  Administered 2014-06-02: 3 mg via INTRAVENOUS

## 2014-06-02 MED ORDER — CEFAZOLIN SODIUM-DEXTROSE 2-3 GM-% IV SOLR
2.0000 g | INTRAVENOUS | Status: AC
  Administered 2014-06-02: 2 g via INTRAVENOUS

## 2014-06-02 MED ORDER — PROPOFOL 10 MG/ML IV BOLUS
INTRAVENOUS | Status: DC | PRN
  Administered 2014-06-02: 150 mg via INTRAVENOUS

## 2014-06-02 MED ORDER — BUPIVACAINE HCL (PF) 0.5 % IJ SOLN
INTRAMUSCULAR | Status: AC
  Filled 2014-06-02: qty 30

## 2014-06-02 MED ORDER — LIDOCAINE HCL (CARDIAC) 20 MG/ML IV SOLN
INTRAVENOUS | Status: AC
  Filled 2014-06-02: qty 5

## 2014-06-02 MED ORDER — OXYCODONE HCL 5 MG PO TABS: 5.0000 mg | ORAL_TABLET | Freq: Four times a day (QID) | ORAL | Status: AC | PRN

## 2014-06-02 MED ORDER — PROPOFOL 10 MG/ML IV BOLUS
INTRAVENOUS | Status: AC
  Filled 2014-06-02: qty 20

## 2014-06-02 MED ORDER — ONDANSETRON HCL 4 MG/2ML IJ SOLN
INTRAMUSCULAR | Status: AC
  Filled 2014-06-02: qty 2

## 2014-06-02 MED ORDER — LACTATED RINGERS IV SOLN
INTRAVENOUS | Status: DC | PRN
  Administered 2014-06-02: 07:00:00 via INTRAVENOUS

## 2014-06-02 MED ORDER — CEFAZOLIN SODIUM-DEXTROSE 2-3 GM-% IV SOLR
INTRAVENOUS | Status: AC
  Filled 2014-06-02: qty 50

## 2014-06-02 MED ORDER — DEXAMETHASONE SODIUM PHOSPHATE 10 MG/ML IJ SOLN
INTRAMUSCULAR | Status: DC | PRN
  Administered 2014-06-02: 10 mg via INTRAVENOUS

## 2014-06-02 MED ORDER — BUPIVACAINE HCL (PF) 0.5 % IJ SOLN
INTRAMUSCULAR | Status: DC | PRN
  Administered 2014-06-02: 30 mL

## 2014-06-02 MED ORDER — FENTANYL CITRATE 0.05 MG/ML IJ SOLN
INTRAMUSCULAR | Status: DC | PRN
  Administered 2014-06-02: 25 ug via INTRAVENOUS
  Administered 2014-06-02: 50 ug via INTRAVENOUS
  Administered 2014-06-02 (×7): 25 ug via INTRAVENOUS

## 2014-06-02 MED ORDER — GLYCOPYRROLATE 0.2 MG/ML IJ SOLN
INTRAMUSCULAR | Status: AC
  Filled 2014-06-02: qty 3

## 2014-06-02 SURGICAL SUPPLY — 34 items
APPLICATOR COTTON TIP 6IN STRL (MISCELLANEOUS) IMPLANT
BENZOIN TINCTURE PRP APPL 2/3 (GAUZE/BANDAGES/DRESSINGS) ×3 IMPLANT
BLADE HEX COATED 2.75 (ELECTRODE) ×3 IMPLANT
BLADE SURG 15 STRL LF DISP TIS (BLADE) ×1 IMPLANT
BLADE SURG 15 STRL SS (BLADE) ×2
BLADE SURG SZ10 CARB STEEL (BLADE) IMPLANT
CLOSURE WOUND 1/2 X4 (GAUZE/BANDAGES/DRESSINGS) ×1
DECANTER SPIKE VIAL GLASS SM (MISCELLANEOUS) IMPLANT
DRAIN PENROSE 18X1/2 LTX STRL (DRAIN) ×3 IMPLANT
DRAPE LAPAROTOMY TRNSV 102X78 (DRAPE) ×3 IMPLANT
ELECT REM PT RETURN 9FT ADLT (ELECTROSURGICAL) ×3
ELECTRODE REM PT RTRN 9FT ADLT (ELECTROSURGICAL) ×1 IMPLANT
GAUZE SPONGE 4X4 12PLY STRL (GAUZE/BANDAGES/DRESSINGS) IMPLANT
GLOVE BIOGEL PI IND STRL 7.0 (GLOVE) IMPLANT
GLOVE BIOGEL PI INDICATOR 7.0 (GLOVE)
GLOVE SURG ORTHO 8.0 STRL STRW (GLOVE) ×18 IMPLANT
GOWN STRL REUS W/TWL LRG LVL3 (GOWN DISPOSABLE) ×3 IMPLANT
GOWN STRL REUS W/TWL XL LVL3 (GOWN DISPOSABLE) ×6 IMPLANT
KIT BASIN OR (CUSTOM PROCEDURE TRAY) ×3 IMPLANT
MESH ULTRAPRO 3X6 7.6X15CM (Mesh General) ×3 IMPLANT
NEEDLE HYPO 25X1 1.5 SAFETY (NEEDLE) ×3 IMPLANT
NS IRRIG 1000ML POUR BTL (IV SOLUTION) ×3 IMPLANT
PACK BASIC VI WITH GOWN DISP (CUSTOM PROCEDURE TRAY) ×3 IMPLANT
PENCIL BUTTON HOLSTER BLD 10FT (ELECTRODE) ×3 IMPLANT
SPONGE LAP 4X18 X RAY DECT (DISPOSABLE) ×3 IMPLANT
STRIP CLOSURE SKIN 1/2X4 (GAUZE/BANDAGES/DRESSINGS) ×2 IMPLANT
SUT MNCRL AB 4-0 PS2 18 (SUTURE) ×3 IMPLANT
SUT NOVA NAB GS-22 2 0 T19 (SUTURE) ×6 IMPLANT
SUT SILK 2 0 SH (SUTURE) ×3 IMPLANT
SUT VIC AB 3-0 SH 18 (SUTURE) ×3 IMPLANT
SYR BULB IRRIGATION 50ML (SYRINGE) ×3 IMPLANT
SYR CONTROL 10ML LL (SYRINGE) ×3 IMPLANT
TOWEL OR 17X26 10 PK STRL BLUE (TOWEL DISPOSABLE) ×3 IMPLANT
YANKAUER SUCT BULB TIP 10FT TU (MISCELLANEOUS) ×3 IMPLANT

## 2014-06-02 NOTE — Transfer of Care (Signed)
Immediate Anesthesia Transfer of Care Note  Patient: Lance Trujillo  Procedure(s) Performed: Procedure(s) (LRB): HERNIA REPAIR INGUINAL ADULT WITH MESH (Right) INSERTION OF MESH (Right)  Patient Location: PACU  Anesthesia Type: General  Level of Consciousness: sedated, patient cooperative and responds to stimulation  Airway & Oxygen Therapy: Patient Spontanous Breathing and Patient connected to face mask oxgen  Post-op Assessment: Report given to PACU RN and Post -op Vital signs reviewed and stable  Post vital signs: Reviewed and stable  Complications: No apparent anesthesia complications

## 2014-06-02 NOTE — Interval H&P Note (Signed)
History and Physical Interval Note:  06/02/2014 7:07 AM  Lance Trujillo  has presented today for surgery, with the diagnosis of RIGHT INGUINAL HERNIA.  The various methods of treatment have been discussed with the patient and family. After consideration of risks, benefits and other options for treatment, the patient has consented to    Procedure(s): HERNIA REPAIR INGUINAL ADULT WITH MESH (Right) INSERTION OF MESH (Right) as a surgical intervention .    The patient's history has been reviewed, patient examined, no change in status, stable for surgery.  I have reviewed the patient's chart and labs.  Questions were answered to the patient's satisfaction.    Earnstine Regal, MD, [facility] Office: [place]

## 2014-06-02 NOTE — Anesthesia Preprocedure Evaluation (Signed)
Anesthesia Evaluation  Patient identified by MRN, date of birth, ID band Patient awake    Reviewed: Allergy & Precautions, NPO status , Patient's Chart, lab work & pertinent test results  Airway Mallampati: II  TM Distance: >3 FB Neck ROM: Full    Dental no notable dental hx. (+) Dental Advisory Given   Pulmonary former smoker,  breath sounds clear to auscultation  Pulmonary exam normal       Cardiovascular hypertension, Pt. on medications and Pt. on home beta blockers + CAD and + CABG + dysrhythmias (hx of post op afib after cabg, hx of bradycardia) Atrial Fibrillation Rhythm:Regular Rate:Normal     Neuro/Psych negative neurological ROS  negative psych ROS   GI/Hepatic negative GI ROS, Neg liver ROS, GERD-  Medicated and Controlled,  Endo/Other  negative endocrine ROSHypothyroidism   Renal/GU negative Renal ROS  negative genitourinary   Musculoskeletal negative musculoskeletal ROS (+)   Abdominal   Peds negative pediatric ROS (+)  Hematology negative hematology ROS (+)   Anesthesia Other Findings   Reproductive/Obstetrics negative OB ROS                             Anesthesia Physical Anesthesia Plan  ASA: III  Anesthesia Plan: General   Post-op Pain Management:    Induction: Intravenous  Airway Management Planned: Oral ETT  Additional Equipment:   Intra-op Plan:   Post-operative Plan: Extubation in OR  Informed Consent: I have reviewed the patients History and Physical, chart, labs and discussed the procedure including the risks, benefits and alternatives for the proposed anesthesia with the patient or authorized representative who has indicated his/her understanding and acceptance.   Dental advisory given  Plan Discussed with: CRNA  Anesthesia Plan Comments:         Anesthesia Quick Evaluation

## 2014-06-02 NOTE — Op Note (Signed)
Inguinal Hernia, Open, Procedure Note  Pre-operative Diagnosis:  Right inguinal hernia, reducible  Post-operative Diagnosis: same  Surgeon:  Earnstine Regal, MD, FACS  Anesthesia:  General  Preparation:  Chlora-prep  Estimated Blood Loss: Minimal  Complications:  none  Indications: The patient presented with a right, reducible hernia.    Procedure Details  The patient was evaluated in the holding area. All of the patient's questions were answered and the proposed procedure was confirmed. The site of the procedure was properly marked. The patient was taken to the Operating Room, identified by name, and the procedure verified as inguinal hernia repair.  The patient was placed in the supine position and underwent induction of anesthesia. A "Time Out" was performed per routine. The lower abdomen and groin were prepped and draped in the usual aseptic fashion.  After ascertaining that an adequate level of anesthesia had been obtained, an incision was made in the groin with a #10 blade.  Dissection was carried through the subcutaneous tissues and hemostasis obtained with the electrocautery.  A Gelpi retractor was placed for exposure.  The external oblique fascia was incised in line with it's fibers and extended through the external inguinal ring.  The cord structures were dissected out of the inguinal canal and encircled with a Penrose drain.  The floor of the inguinal canal was dissected out.  There was a small direct inguinal hernia which was easily reduced.  The cord was explored and a small indirect sac was dissected out and a high ligation performed with a 2-0 silk suture and the sac excised.  The floor of the inguinal canal was reconstructed with Ethicon Ultrapro mesh cut to the appropriate dimensions.  It was secured to the pubic tubercle with a 2-0 Novafil suture and along the inguinal ligament with a running 2-0 Novafil suture.  Mesh was split to accommodate the cord structures.  The superior  margin of the mesh was secured to the transversalis and internal oblique musculature with interrupted 2-0 Novafil sutures.  The tails of the mesh were overlapped lateral to the cord structures and secured to the inguinal ligament with interrupted 2-0 Novafil sutures to recreate the internal inguinal ring.  Cord structures were returned to the inguinal canal.  Local anesthetic was infiltrated throughout the field.  External oblique fascia was closed with interrupted 3-0 Vicryl sutures.  Subcutaneous tissues were closed with interrupted 3-0 Vicryl sutures.  Skin was anesthetized with local anesthetic, and the skin edges were re-approximated with a running 4-0 Monocryl suture.  Wound was washed and dried and benzoin and steristrips were applied.  A gauze dressing was applied.  Instrument, sponge, and needle counts were correct prior to closure and at the conclusion of the case.  The patient tolerated the procedure well.  The patient was awakened from anesthesia and brought to the recovery room in stable condition.  Earnstine Regal, MD, [facility] Office: [phone]

## 2014-06-02 NOTE — Anesthesia Postprocedure Evaluation (Signed)
  Anesthesia Post-op Note  Patient: Lance Trujillo  Procedure(s) Performed: Procedure(s) (LRB): HERNIA REPAIR INGUINAL ADULT WITH MESH (Right) INSERTION OF MESH (Right)  Patient Location: PACU  Anesthesia Type: General  Level of Consciousness: awake and alert   Airway and Oxygen Therapy: Patient Spontanous Breathing  Post-op Pain: mild  Post-op Assessment: Post-op Vital signs reviewed, Patient's Cardiovascular Status Stable, Respiratory Function Stable, Patent Airway and No signs of Nausea or vomiting  Last Vitals:  Filed Vitals:   06/02/14 0845  BP: 133/62  Pulse: 56  Temp: 36.4 C  Resp: 11    Post-op Vital Signs: stable   Complications: No apparent anesthesia complications

## 2014-06-02 NOTE — Anesthesia Procedure Notes (Signed)
Procedure Name: Intubation Date/Time: 06/02/2014 7:28 AM Performed by: Maxwell Caul Pre-anesthesia Checklist: Patient identified, Emergency Drugs available, Suction available and Patient being monitored Patient Re-evaluated:Patient Re-evaluated prior to inductionPreoxygenation: Pre-oxygenation with 100% oxygen Intubation Type: IV induction Ventilation: Mask ventilation without difficulty Laryngoscope Size: Mac and 4 Grade View: Grade I Tube type: Oral Tube size: 7.5 mm Number of attempts: 1 Placement Confirmation: ETT inserted through vocal cords under direct vision,  positive ETCO2 and breath sounds checked- equal and bilateral Secured at: 21 cm Tube secured with: Tape Dental Injury: Teeth and Oropharynx as per pre-operative assessment

## 2014-06-03 ENCOUNTER — Encounter (HOSPITAL_COMMUNITY): Payer: Self-pay | Admitting: Surgery

## 2015-08-11 IMAGING — CR DG CHEST 2V
2 series · 2 of 2 positions shown · non-contrast
Comparison: 01/30/2012

CLINICAL DATA: Preop hernia repair. Cold 2 weeks ago. Still with
residual cough. Previous smoker. Hypertension.

EXAM:
CHEST  2 VIEW

[w chest pa]
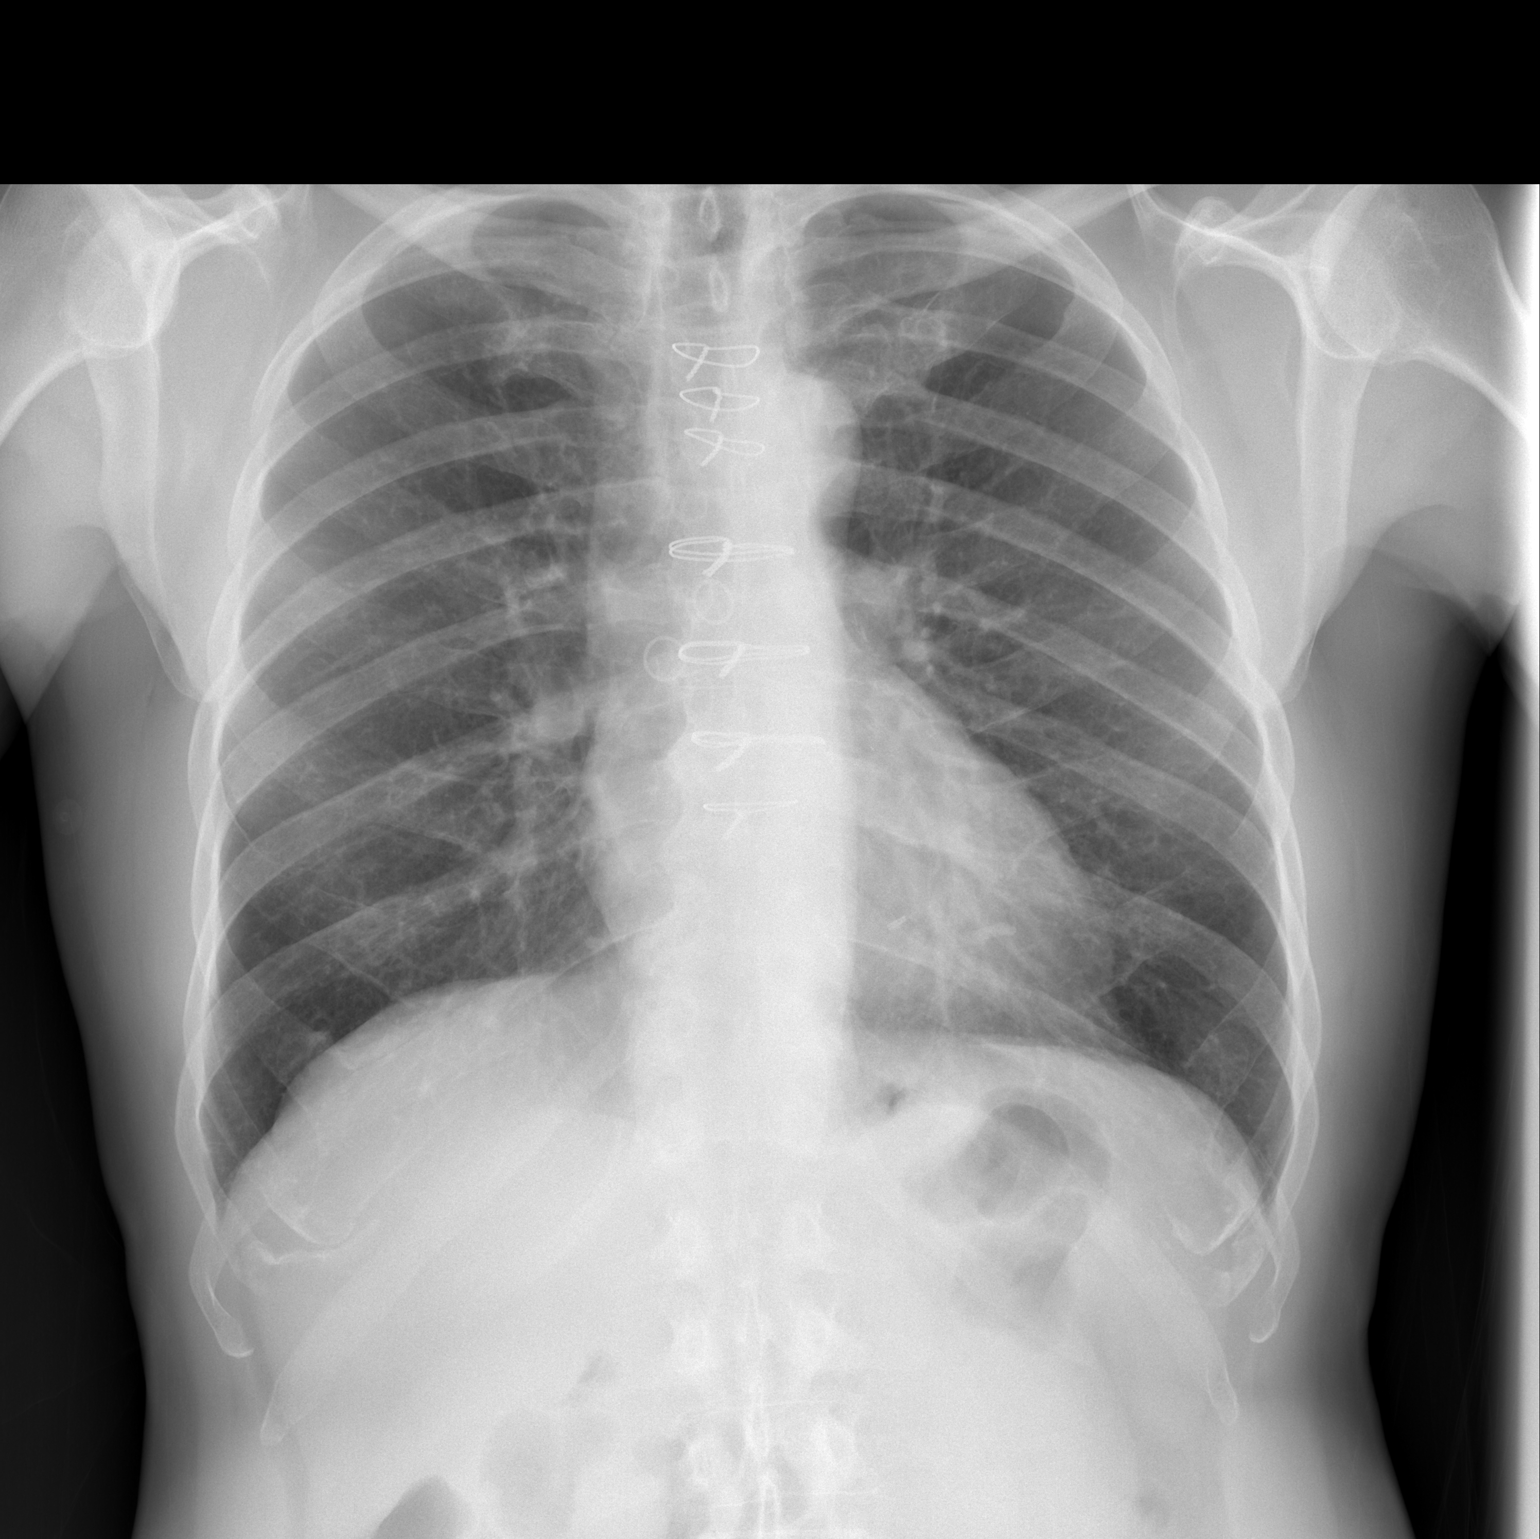

[w chest lat]
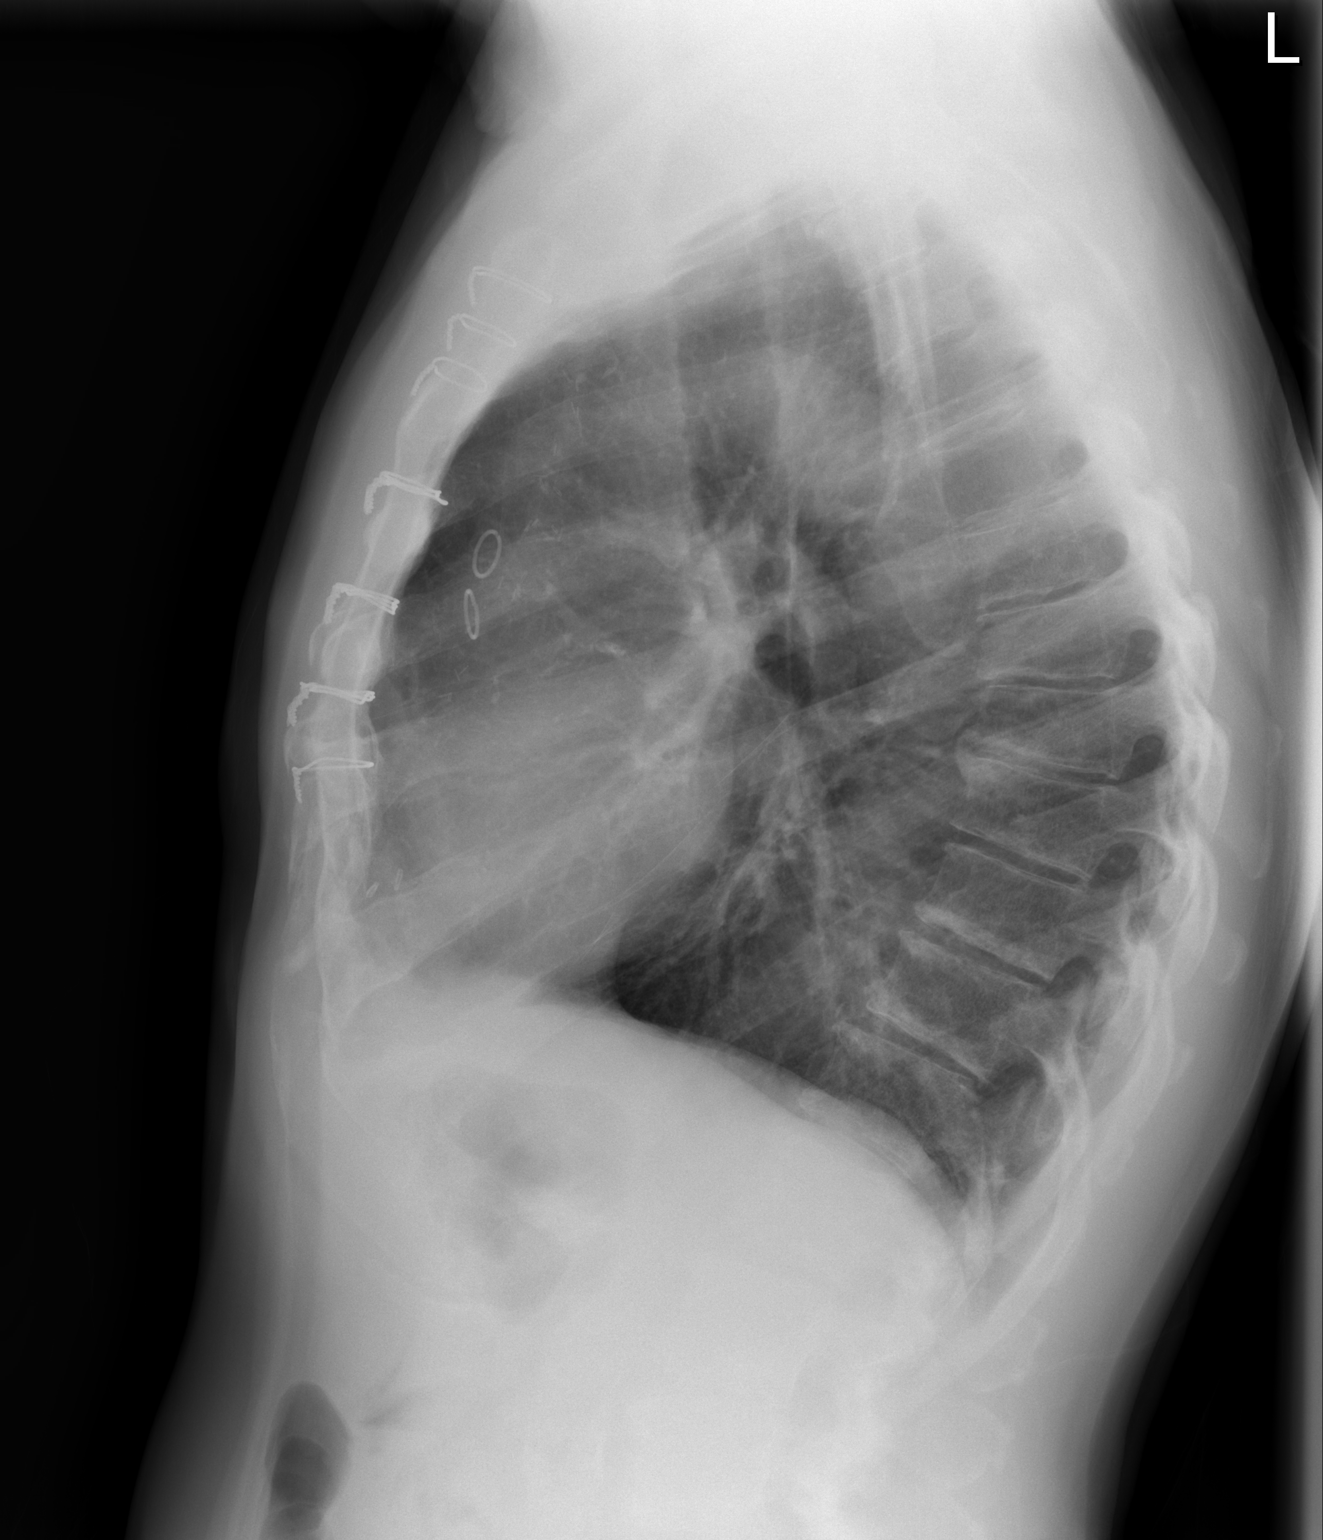

[2 of 2 positions shown; findings below may reference images not displayed]

FINDINGS: Postoperative changes in the mediastinum. Mild hyperinflation.
Interval resolution of previous pleural effusions. Normal heart size
and pulmonary vascularity. No focal airspace disease or
consolidation in the lungs. No blunting of costophrenic angles. No
pneumothorax. Mediastinal contours appear intact. Degenerative
changes in the spine.
IMPRESSION: Hyperinflation.  No evidence of active pulmonary disease.
